# Patient Record
Sex: Female | Born: 1965 | Race: White | Hispanic: No | Marital: Single | Smoking: Current every day smoker
Health system: Southern US, Community
[De-identification: ages and names within clinical notes are randomized; demographics above are authoritative.]

## PROBLEM LIST (undated history)

## (undated) DIAGNOSIS — I10 Essential (primary) hypertension: Secondary | ICD-10-CM

## (undated) DIAGNOSIS — E039 Hypothyroidism, unspecified: Secondary | ICD-10-CM

## (undated) DIAGNOSIS — E782 Mixed hyperlipidemia: Secondary | ICD-10-CM

## (undated) DIAGNOSIS — Z8619 Personal history of other infectious and parasitic diseases: Secondary | ICD-10-CM

## (undated) DIAGNOSIS — I2510 Atherosclerotic heart disease of native coronary artery without angina pectoris: Secondary | ICD-10-CM

## (undated) DIAGNOSIS — Z951 Presence of aortocoronary bypass graft: Secondary | ICD-10-CM

## (undated) DIAGNOSIS — K759 Inflammatory liver disease, unspecified: Secondary | ICD-10-CM

## (undated) DIAGNOSIS — R011 Cardiac murmur, unspecified: Secondary | ICD-10-CM

## (undated) HISTORY — DX: Cardiac murmur, unspecified: R01.1

## (undated) HISTORY — DX: Inflammatory liver disease, unspecified: K75.9

## (undated) HISTORY — PX: CARDIAC SURGERY: SHX584

## (undated) HISTORY — PX: TONSILLECTOMY: SUR1361

---
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
## 2008-09-11 ENCOUNTER — Ambulatory Visit: Payer: Self-pay | Admitting: Internal Medicine

## 2008-10-25 ENCOUNTER — Ambulatory Visit: Payer: Self-pay | Admitting: Internal Medicine

## 2008-10-27 ENCOUNTER — Ambulatory Visit: Payer: Self-pay | Admitting: Internal Medicine

## 2008-11-01 ENCOUNTER — Ambulatory Visit: Payer: Self-pay | Admitting: Family Medicine

## 2009-03-01 ENCOUNTER — Ambulatory Visit: Payer: Self-pay | Admitting: Cardiology

## 2011-08-14 ENCOUNTER — Ambulatory Visit: Payer: Self-pay

## 2012-06-15 ENCOUNTER — Ambulatory Visit: Payer: Self-pay | Admitting: Internal Medicine

## 2012-06-15 LAB — COMPREHENSIVE METABOLIC PANEL
Albumin: 3.5 g/dL (ref 3.4–5.0)
Alkaline Phosphatase: 71 U/L (ref 50–136)
Anion Gap: 9 (ref 7–16)
BUN: 8 mg/dL (ref 7–18)
Bilirubin,Total: 0.5 mg/dL (ref 0.2–1.0)
Creatinine: 0.68 mg/dL (ref 0.60–1.30)
Glucose: 166 mg/dL — ABNORMAL HIGH (ref 65–99)
Osmolality: 268 (ref 275–301)
Potassium: 4.3 mmol/L (ref 3.5–5.1)
SGPT (ALT): 25 U/L (ref 12–78)
Sodium: 133 mmol/L — ABNORMAL LOW (ref 136–145)
Total Protein: 7.1 g/dL (ref 6.4–8.2)

## 2012-06-15 LAB — URINALYSIS, COMPLETE
Bilirubin,UR: NEGATIVE
Blood: NEGATIVE
Glucose,UR: NEGATIVE mg/dL (ref 0–75)
Leukocyte Esterase: NEGATIVE
Nitrite: NEGATIVE
Specific Gravity: 1.01 (ref 1.003–1.030)

## 2012-06-15 LAB — CBC WITH DIFFERENTIAL/PLATELET
Basophil #: 0 10*3/uL (ref 0.0–0.1)
Eosinophil %: 0.5 %
HGB: 16 g/dL (ref 12.0–16.0)
Lymphocyte #: 3.2 10*3/uL (ref 1.0–3.6)
MCH: 30.5 pg (ref 26.0–34.0)
Monocyte %: 4.9 %
Neutrophil %: 68.6 %
Platelet: 295 10*3/uL (ref 150–440)
RBC: 5.25 10*6/uL — ABNORMAL HIGH (ref 3.80–5.20)
RDW: 14.2 % (ref 11.5–14.5)

## 2012-06-17 LAB — URINE CULTURE

## 2016-06-10 ENCOUNTER — Ambulatory Visit
Admission: EM | Admit: 2016-06-10 | Discharge: 2016-06-10 | Disposition: A | Discharge status: Home / Self Care | Payer: BLUE CROSS/BLUE SHIELD | Attending: Family Medicine | Admitting: Family Medicine

## 2016-06-10 ENCOUNTER — Encounter: Payer: Self-pay | Admitting: *Deleted

## 2016-06-10 DIAGNOSIS — J0110 Acute frontal sinusitis, unspecified: Secondary | ICD-10-CM

## 2016-06-10 HISTORY — DX: Atherosclerotic heart disease of native coronary artery without angina pectoris: I25.10

## 2016-06-10 MED ORDER — AMOXICILLIN-POT CLAVULANATE 875-125 MG PO TABS: 1.0000 | ORAL_TABLET | Freq: Two times a day (BID) | ORAL | 0 refills | Status: DC

## 2016-06-10 NOTE — ED Triage Notes (Signed)
Onset of hoarseness 1 month ago and has been persistent since, worse in the morning. A few days ago onset of sore throat, runny nose- yellow, and right ear fullness. Denies fever.

## 2016-06-10 NOTE — Discharge Instructions (Signed)
Take medication as prescribed. Rest. Drink plenty of fluids.  ° °Follow up with your primary care physician this week as needed. Return to Urgent care for new or worsening concerns.  ° °

## 2016-06-10 NOTE — ED Provider Notes (Signed)
[facility] ____________________________________________  Time seen: Approximately 12:50 PM  I have reviewed the triage vital signs and the nursing notes.   HISTORY  Chief Complaint Nasal Congestion; Sore Throat; Headache; Ear Fullness; and Hoarse   HPI Morgan Wright is a 50 y.o. female presenting for the complaints of 3-4 weeks of intermittent hoarse voice and intermittent nasal drainage. Patient reports over the last few days drainage has increased with accompanying sinus pressure, sinus clogged sensation and ears feeling clogged. Patient reports intermittent cough, cough worse at night with post nasal drainage sensation. Patient reports sinuses currently feel clogged with pressure sensation. Reports blowing nose and coughing up thick mucous. Denies fevers. Reports multiple sick contacts around her. Reports unresolved with over-the-counter cough and congestion medications. Denies fevers. States occasional sore throat. Denies history of seasonal allergies.   Reports continues to eat and drink well. Reports has continued to remain active. Denies chest pain, shortness of breath, abdominal pain, dysuria, extremity pain, extremity swelling, dizziness, vision changes or other complaints. Denies recent antibiotic use or recent hospitalization.  No LMP recorded. Patient is postmenopausal.  Past Medical History:  Diagnosis Date  . Coronary artery disease     There are no active problems to display for this patient.   Past Surgical History:  Procedure Laterality Date  . CARDIAC SURGERY    . CESAREAN SECTION        No current facility-administered medications for this encounter.   Current Outpatient Prescriptions:  .  amoxicillin-clavulanate (AUGMENTIN) 875-125 MG tablet, Take 1 tablet by mouth every 12 (twelve) hours., Disp: 20 tablet, Rfl: 0  Allergies Review of patient's allergies indicates no known allergies.  History reviewed. No pertinent family  history.  Social History Social History  Substance Use Topics  . Smoking status: Current Every Day Smoker  . Smokeless tobacco: Never Used  . Alcohol use No    Review of Systems Constitutional: No fever/chills Eyes: No visual changes. ENT:As above Cardiovascular: Denies chest pain. Respiratory: Denies shortness of breath. Gastrointestinal: No abdominal pain.  No nausea, no vomiting.  No diarrhea.  No constipation. Genitourinary: Negative for dysuria. Musculoskeletal: Negative for back pain. Skin: Negative for rash. Neurological: Negative for headaches, focal weakness or numbness.  10-point ROS otherwise negative.  ____________________________________________   PHYSICAL EXAM:  VITAL SIGNS: ED Triage Vitals  Enc Vitals Group     BP 06/10/16 1157 118/79     Pulse Rate 06/10/16 1157 95     Resp 06/10/16 1157 16     Temp 06/10/16 1157 98.7 F (37.1 C)     Temp Source 06/10/16 1157 Oral     SpO2 06/10/16 1157 98 %     Weight 06/10/16 1200 170 lb (77.1 kg)     Height 06/10/16 1200 5\' 1"  (1.549 m)     Head Circumference --      Peak Flow --      Pain Score --      Pain Loc --      Pain Edu? --      Excl. in GC? --     Constitutional: Alert and oriented. Well appearing and in no acute distress. Eyes: Conjunctivae are normal. PERRL. EOMI. Head: Atraumatic.Mild to moderate tenderness to palpation bilateral frontal and mild tenderness to bilateral maxillary sinuses. No swelling. No erythema.   Ears: no erythema,nontender and normal TMs bilaterally.   Nose: nasal congestion with bilateral nasal turbinate erythema and edema.   Mouth/Throat: Mucous membranes are moist.  Oropharynx non-erythematous.No tonsillar swelling  or exudate.  Neck: No stridor.  No cervical spine tenderness to palpation. Hematological/Lymphatic/Immunilogical: No cervical lymphadenopathy. Cardiovascular: Normal rate, regular rhythm. Grossly normal heart sounds.  Good peripheral circulation. Respiratory:  Normal respiratory effort.  No retractions. Lungs CTAB. No wheezes, rales or rhonchi. Good air movement. Occasional dry cough noted in room. Gastrointestinal: Soft and nontender. No distention.  Musculoskeletal: No lower or upper extremity tenderness nor edema.  Bilateral pedal pulses equal and easily palpated. No cervical, thoracic or lumbar tenderness to palpation.  Neurologic:  Normal speech and language. No gross focal neurologic deficits are appreciated. No gait instability. Skin:  Skin is warm, dry and intact. No rash noted. Psychiatric: Mood and affect are normal. Speech and behavior are normal.  ___________________________________________   LABS (all labs ordered are listed, but only abnormal results are displayed)  Labs Reviewed - No data to display ____________________________________________  PROCEDURES Procedures    INITIAL IMPRESSION / ASSESSMENT AND PLAN / ED COURSE  Pertinent labs & imaging results that were available during my care of the patient were reviewed by me and considered in my medical decision making (see chart for details).  Well-appearing patient. No acute distress. Presents for the complaints of cough, congestion, sinus pressure and ear clogged sensation. Suspect frontal sinusitis. Will treat patient with oral Augmentin, and encouraged supportive care. Denies need for prescription cough medications. Encourage rest, fluids, lozenges use. Discussed indication, risks and benefits of medications with patient.  Discussed follow up with Primary care physician this week. Discussed follow up and return parameters including no resolution or any worsening concerns. Patient verbalized understanding and agreed to plan.   ____________________________________________   FINAL CLINICAL IMPRESSION(S) / ED DIAGNOSES  Final diagnoses:  Acute frontal sinusitis, recurrence not specified     Discharge Medication List as of 06/10/2016 12:53 PM    START taking these  medications   Details  amoxicillin-clavulanate (AUGMENTIN) 875-125 MG tablet Take 1 tablet by mouth every 12 (twelve) hours., Starting Tue 06/10/2016, Normal        Note: This dictation was prepared with Dragon dictation along with smaller phrase technology. Any transcriptional errors that result from this process are unintentional.    Clinical Course      Renford DillsLindsey Micholas Drumwright, NP 06/10/16 1754

## 2016-06-13 ENCOUNTER — Telehealth: Payer: Self-pay | Admitting: *Deleted

## 2016-06-13 NOTE — Telephone Encounter (Signed)
Called patient, no answer, left message requesting call back if patient has any questions or concerns.

## 2018-11-12 ENCOUNTER — Ambulatory Visit
Admission: EM | Admit: 2018-11-12 | Discharge: 2018-11-12 | Disposition: A | Discharge status: Home / Self Care | Payer: BLUE CROSS/BLUE SHIELD | Attending: Family Medicine | Admitting: Family Medicine

## 2018-11-12 ENCOUNTER — Other Ambulatory Visit: Payer: Self-pay

## 2018-11-12 ENCOUNTER — Encounter: Payer: Self-pay | Admitting: Emergency Medicine

## 2018-11-12 DIAGNOSIS — J101 Influenza due to other identified influenza virus with other respiratory manifestations: Secondary | ICD-10-CM

## 2018-11-12 DIAGNOSIS — F17200 Nicotine dependence, unspecified, uncomplicated: Secondary | ICD-10-CM | POA: Diagnosis not present

## 2018-11-12 LAB — RAPID INFLUENZA A&B ANTIGENS
Influenza A (ARMC): POSITIVE — AB
Influenza B (ARMC): NEGATIVE

## 2018-11-12 MED ORDER — OSELTAMIVIR PHOSPHATE 75 MG PO CAPS: 75.0000 mg | ORAL_CAPSULE | Freq: Two times a day (BID) | ORAL | 0 refills | Status: DC

## 2018-11-12 NOTE — ED Triage Notes (Signed)
Patient c/o feeling weak, generalized body aches, cough, sinus headache that started 2 days ago.

## 2018-11-13 NOTE — ED Provider Notes (Signed)
[facility]    CSN: [id number] Arrival date & time: 11/12/18  3790     History   Chief Complaint Chief Complaint  Patient presents with  . Cough  . Generalized Body Aches    HPI Morgan Wright is a 53 y.o. female.   The history is provided by the patient.  Cough  Associated symptoms: fever, myalgias and rhinorrhea   Associated symptoms: no wheezing   URI  Presenting symptoms: congestion, cough, fatigue, fever and rhinorrhea   Severity:  Moderate Onset quality:  Sudden Duration:  2 days Timing:  Constant Progression:  Unchanged Chronicity:  New Relieved by:  None tried Ineffective treatments:  None tried Associated symptoms: myalgias   Associated symptoms: no wheezing   Risk factors: sick contacts     Past Medical History:  Diagnosis Date  . Coronary artery disease     There are no active problems to display for this patient.   Past Surgical History:  Procedure Laterality Date  . CARDIAC SURGERY    . CESAREAN SECTION      OB History   No obstetric history on file.      Home Medications    Prior to Admission medications   Medication Sig Start Date End Date Taking? Authorizing Provider  amoxicillin-clavulanate (AUGMENTIN) 875-125 MG tablet Take 1 tablet by mouth every 12 (twelve) hours. 06/10/16   Renford Dills, NP  oseltamivir (TAMIFLU) 75 MG capsule Take 1 capsule (75 mg total) by mouth 2 (two) times daily. 11/12/18   Payton Mccallum, MD    Family History History reviewed. No pertinent family history.  Social History Social History   Tobacco Use  . Smoking status: Current Every Day Smoker  . Smokeless tobacco: Never Used  Substance Use Topics  . Alcohol use: No  . Drug use: Not on file     Allergies   Patient has no known allergies.   Review of Systems Review of Systems  Constitutional: Positive for fatigue and fever.  HENT: Positive for congestion and rhinorrhea.   Respiratory: Positive for cough. Negative for  wheezing.   Musculoskeletal: Positive for myalgias.     Physical Exam Triage Vital Signs ED Triage Vitals  Enc Vitals Group     BP 11/12/18 0828 (!) 158/73     Pulse Rate 11/12/18 0828 91     Resp 11/12/18 0828 18     Temp 11/12/18 0828 98.7 F (37.1 C)     Temp Source 11/12/18 0828 Oral     SpO2 11/12/18 0828 96 %     Weight 11/12/18 0826 180 lb (81.6 kg)     Height 11/12/18 0826 5\' 1"  (1.549 m)     Head Circumference --      Peak Flow --      Pain Score 11/12/18 0826 0     Pain Loc --      Pain Edu? --      Excl. in GC? --    No data found.  Updated Vital Signs BP (!) 158/73 (BP Location: Right Arm)   Pulse 91   Temp 98.7 F (37.1 C) (Oral)   Resp 18   Ht 5\' 1"  (1.549 m)   Wt 81.6 kg   SpO2 96%   BMI 34.01 kg/m   Visual Acuity Right Eye Distance:   Left Eye Distance:   Bilateral Distance:    Right Eye Near:   Left Eye Near:    Bilateral Near:     Physical Exam Vitals  signs and nursing note reviewed.  Constitutional:      General: She is not in acute distress.    Appearance: She is well-developed. She is not toxic-appearing or diaphoretic.  HENT:     Head: Normocephalic and atraumatic.     Mouth/Throat:     Pharynx: Uvula midline. No oropharyngeal exudate.  Neck:     Musculoskeletal: Normal range of motion and neck supple.     Thyroid: No thyromegaly.  Cardiovascular:     Rate and Rhythm: Normal rate and regular rhythm.     Heart sounds: Normal heart sounds.  Pulmonary:     Effort: Pulmonary effort is normal. No respiratory distress.     Breath sounds: Normal breath sounds. No stridor. No wheezing, rhonchi or rales.  Lymphadenopathy:     Cervical: No cervical adenopathy.  Neurological:     Mental Status: She is alert.      UC Treatments / Results  Labs (all labs ordered are listed, but only abnormal results are displayed) Labs Reviewed  RAPID INFLUENZA A&B ANTIGENS ([facility] ONLY) - Abnormal; Notable for the following components:      Result  Value   Influenza A ([facility]) POSITIVE (*)    All other components within normal limits    EKG None  Radiology No results found.  Procedures Procedures (including critical care time)  Medications Ordered in UC Medications - No data to display  Initial Impression / Assessment and Plan / UC Course  I have reviewed the triage vital signs and the nursing notes.  Pertinent labs & imaging results that were available during my care of the patient were reviewed by me and considered in my medical decision making (see chart for details).      Final Clinical Impressions(s) / UC Diagnoses   Final diagnoses:  Influenza A    ED Prescriptions    Medication Sig Dispense Auth. Provider   oseltamivir (TAMIFLU) 75 MG capsule Take 1 capsule (75 mg total) by mouth 2 (two) times daily. 10 capsule Payton Mccallum, MD     1. Lab results and diagnosis reviewed with patient 2. rx as per orders above; reviewed possible side effects, interactions, risks and benefits  3. Recommend supportive treatment with rest, fluids, otc analgesics prn  4. Follow-up prn if symptoms worsen or don't improve   Controlled Substance Prescriptions [place] Controlled Substance Registry consulted? Not Applicable   Payton Mccallum, MD 11/13/18 [phone]

## 2018-11-24 DIAGNOSIS — I10 Essential (primary) hypertension: Secondary | ICD-10-CM | POA: Diagnosis not present

## 2018-11-24 DIAGNOSIS — E782 Mixed hyperlipidemia: Secondary | ICD-10-CM | POA: Insufficient documentation

## 2018-11-24 DIAGNOSIS — I2510 Atherosclerotic heart disease of native coronary artery without angina pectoris: Secondary | ICD-10-CM | POA: Insufficient documentation

## 2018-11-24 DIAGNOSIS — Z720 Tobacco use: Secondary | ICD-10-CM | POA: Insufficient documentation

## 2018-11-26 ENCOUNTER — Other Ambulatory Visit: Payer: Self-pay

## 2018-11-26 ENCOUNTER — Encounter: Payer: Self-pay | Admitting: Family Medicine

## 2018-11-26 ENCOUNTER — Ambulatory Visit (INDEPENDENT_AMBULATORY_CARE_PROVIDER_SITE_OTHER): Payer: Self-pay | Admitting: Family Medicine

## 2018-11-26 VITALS — BP 166/84 | HR 88 | Temp 98.7°F | Resp 16 | Ht 61.0 in | Wt 186.4 lb

## 2018-11-26 DIAGNOSIS — Z8619 Personal history of other infectious and parasitic diseases: Secondary | ICD-10-CM | POA: Insufficient documentation

## 2018-11-26 DIAGNOSIS — I2510 Atherosclerotic heart disease of native coronary artery without angina pectoris: Secondary | ICD-10-CM

## 2018-11-26 DIAGNOSIS — E782 Mixed hyperlipidemia: Secondary | ICD-10-CM

## 2018-11-26 DIAGNOSIS — I10 Essential (primary) hypertension: Secondary | ICD-10-CM

## 2018-11-26 DIAGNOSIS — R490 Dysphonia: Secondary | ICD-10-CM

## 2018-11-26 DIAGNOSIS — Z951 Presence of aortocoronary bypass graft: Secondary | ICD-10-CM

## 2018-11-26 DIAGNOSIS — E039 Hypothyroidism, unspecified: Secondary | ICD-10-CM

## 2018-11-26 DIAGNOSIS — Z7689 Persons encountering health services in other specified circumstances: Secondary | ICD-10-CM

## 2018-11-26 DIAGNOSIS — R7309 Other abnormal glucose: Secondary | ICD-10-CM

## 2018-11-26 DIAGNOSIS — E1169 Type 2 diabetes mellitus with other specified complication: Secondary | ICD-10-CM | POA: Insufficient documentation

## 2018-11-26 DIAGNOSIS — F17210 Nicotine dependence, cigarettes, uncomplicated: Secondary | ICD-10-CM | POA: Insufficient documentation

## 2018-11-26 DIAGNOSIS — Z720 Tobacco use: Secondary | ICD-10-CM

## 2018-11-26 HISTORY — DX: Essential (primary) hypertension: I10

## 2018-11-26 HISTORY — DX: Atherosclerotic heart disease of native coronary artery without angina pectoris: I25.10

## 2018-11-26 HISTORY — DX: Personal history of other infectious and parasitic diseases: Z86.19

## 2018-11-26 HISTORY — DX: Hypothyroidism, unspecified: E03.9

## 2018-11-26 HISTORY — DX: Presence of aortocoronary bypass graft: Z95.1

## 2018-11-26 HISTORY — DX: Mixed hyperlipidemia: E78.2

## 2018-11-26 MED ORDER — METOPROLOL SUCCINATE ER 50 MG PO TB24: 50.0000 mg | ORAL_TABLET | Freq: Every day | ORAL | 1 refills | Status: DC

## 2018-11-26 MED ORDER — LOSARTAN POTASSIUM 50 MG PO TABS: 50.0000 mg | ORAL_TABLET | Freq: Every day | ORAL | 1 refills | Status: DC

## 2018-11-26 NOTE — Assessment & Plan Note (Signed)
Considered Morbid Obesity with BMI >35 - due to co morbid conditions HTN, HLD, CAD, Hypothyroidism Encourage improve lifestyle diet exercise

## 2018-11-26 NOTE — Assessment & Plan Note (Signed)
Chronic problem CAD, s/p CABG Followed by Dr Lady Gary  Starting new med management today, anti HTN with Losartan, BB metoprolol, will add Statin upcoming pending lab results lipid in a week - Should be on Aspirin 81mg  daily as well

## 2018-11-26 NOTE — Patient Instructions (Addendum)
Thank you for coming to the office today.  Referral to [facility] - stay tuned for apt  --------------------------------------------  For your smoking cessation, here is the plan: - TAKE CHANTIX ALWAYS WITH FOOD - to avoid or limit nausea - To quit smoking - wait to start this treatment until you are mentally ready to quit. - Choose a quit date in the future. Start taking the medicine about 1-2 weeks away from your quit date. Reduce the number of cigarettes daily until you eventually QUIT completely on your quit date  Initial rx is good for 1 month - need to talk to [place] to figure out if we can extend for 2 more months of medicine even if quit already smoking  -------  Start BP medications with Losartan and Metoprolol - one a day every day for heart and BP  We will check labs - likely start a Statin cholesterol medicine next week after labs and likely start a Thyroid medicine  Thyroid medicine (Levothyroxine) - should be taken on empty stomach before breakfast  Cholesterol pill in evening.  You are at increased risk of future Cardiovascular complications such as Heart Attack or Stroke from an artery blockage due to abnormal cholesterol and/or risk factors. - As discussed, Statin Cholesterol pills both can both LOWER cholesterol and REDUCE this future risk of heart attack and stroke  If you develop mild aches or pains in muscle or joint that does NOT improve or go away after first 3-4 weeks then this may require [place] to adjust the dose. First I would recommend STOPPING the medication for a few weeks until your ache and pain symptoms completely RESOLVE. Then you can restart at a LOWER DOSE either HALF a pill at bedtime every night or LESS OFTEN such as one pill a week only and then gradually increase to every other day or max dose of 3 times a week  Lastly, sometimes we need to try other versions of this medicine to find one that works for you and does not cause side effects.   DUE for  FASTING BLOOD WORK (no food or drink after midnight before the lab appointment, only water or coffee without cream/sugar on the morning of)  SCHEDULE "Lab Only" visit in the morning at the clinic for lab draw in 1 WEEKS   - Make sure Lab Only appointment is at about 1 week before your next appointment, so that results will be available  For Lab Results, once available within 2-3 days of blood draw, you can can log in to MyChart online to view your results and a brief explanation. Also, we can discuss results at next follow-up visit.   Please schedule a Follow-up Appointment to: Return in about 4 weeks (around 12/24/2018) for 4 week follow-up Smoking, HTN, Thyroid/HLD, ENT update hoarseness.  If you have any other questions or concerns, please feel free to call the office or send a message through MyChart. You may also schedule an earlier appointment if necessary.  Additionally, you may be receiving a survey about your experience at our office within a few days to 1 week by e-mail or mail. We value your feedback.  Saralyn Pilar, DO [facility], [place]

## 2018-11-26 NOTE — Assessment & Plan Note (Signed)
Previous problem based on history No current thyroid lab result  Will order labs TSH Free T4 next week, check result and contact patient if need to add back on levothyroxine Follow-up within 4 weeks

## 2018-11-26 NOTE — Assessment & Plan Note (Signed)
Prior reported positive Hep C in past Untreated, since 1990s  Check lab Hep C ab and reflex, upcoming within a week - if positive confirm still chronic infection will refer to [facility] for Hep C management

## 2018-11-26 NOTE — Assessment & Plan Note (Addendum)
Active smoker, 0.75ppd chronic >40 yr - Has chantix has not started yet - from Cardiology - almost ready to quit  Advised her to start med when ready, try to get her daughter to quit at same time  She should follow-up with [place] within next 3-4 weeks to discuss extension on Chantix if remains smoke free, will pick quit date as well  Discussion today >5 minutes (<10 minutes) specifically on counseling on risks of tobacco use, complications, treatment, smoking cessation.

## 2018-11-26 NOTE — Assessment & Plan Note (Signed)
Followed by Cardiology Dr Waynetta Pean See A&P

## 2018-11-26 NOTE — Progress Notes (Addendum)
Subjective:    Patient ID: Morgan Wright, female    DOB: 30-Dec-1965, 53 y.o.   MRN: [id number]  Morgan Wright is a 53 y.o. female presenting on 11/26/2018 for Establish Care (laryngitis on 03/20 was diagnosed with Flu A); Hypertension; Hyperlipidemia; Hypothyroidism; and Laryngitis  Here to establish care - previously seen at [facility] on 11/12/18 - now asked to get a PCP.  HPI   CHRONIC HTN: Reports history of elevated BP. Not checking BP at home. Acknowledges history of elevated BP in past used to be on medication. Her cardiologist recently advised her to come in to see PCP for BP management Current Meds - None  - cannot recall which pills she had taken in past. Lifestyle: - Trying to improve diet. Not following particular low salt diet Denies CP, dyspnea, HA, edema, dizziness / lightheadedness  Hypothyroidism Prior history of low thyroid based on reported blood work, she used to take medication, does not recall dose. No recent lab result for TSH  HYPERLIPIDEMIA: - Reports concerns history of elevated lipids last checked years ago - used to be on a cholesterol medication statin in past, does not recall which one or dose, interested to restart this Due for labs soon  Tobacco Abuse - Active smoker,  0.75 ppd for >40 years approximately, longest quit duration was 3 weeks only cold [place], problem is her daughter lives at home with her and smokes as well difficult for them both to quit but she will try - She was recently rx Chantix from her Cardiologist Dr Lady Gary few days ago has not started this yet.  Chronic Hoarseness Voice / Laryngitis Reported that about 8 months ago developed a URI that caused laryngitis and she eventually improved and her respiratory symptoms resolved but then she developed persistent laryngitis with hoarse voice - Currently still endorses hoarse voice quality, but she does not have any pain or productive cough or dysphagia difficulty  swallowing or choking  CAD, s/p CABG Recently re-established with [facility] Cardiology Dr Lady Gary, on 11/24/18, prior history of cardiac cath 02/2009 with extensive CAD required CABG x 3 at [facility] Dr Marshell Garfinkel  - Reviewed her recent encounter, she was interested to start medicine and have labs from her PCP now Denies any chest pain or dyspnea  History of Hepatitis C In past reported lab test show positive Hep C, she was never treated, says this was in 1990s. Interested in pursuing this as well with repeat testing and consider refer for treatment.  Recent Influenza - treated and now resolved, doing well afebrile, no cough.  Depression screen PHQ 2/9 11/26/2018  Decreased Interest 0  Down, Depressed, Hopeless 0  PHQ - 2 Score 0    Past Medical History:  Diagnosis Date  . Coronary artery disease   . Heart murmur   . Hepatitis    Past Surgical History:  Procedure Laterality Date  . CARDIAC SURGERY    . CESAREAN SECTION     Social History   Socioeconomic History  . Marital status: Single    Spouse name: Not on file  . Number of children: Not on file  . Years of education: [employer]  . Highest education level: High school graduate  Occupational History  . Occupation: Shipping/Receiving    Comment: [employer]  Social Needs  . Financial resource strain: Not on file  . Food insecurity:    Worry: Not on file    Inability: Not on file  .  Transportation needs:    Medical: Not on file    Non-medical: Not on file  Tobacco Use  . Smoking status: Current Every Day Smoker    Packs/day: 0.75    Years: 40.00    Pack years: 30.00    Types: Cigarettes  . Smokeless tobacco: Never Used  Substance and Sexual Activity  . Alcohol use: No  . Drug use: Never  . Sexual activity: Not on file  Lifestyle  . Physical activity:    Days per week: Not on file    Minutes per session: Not on file  . Stress: Not on file  Relationships  . Social connections:    Talks on phone: Not on file     Gets together: Not on file    Attends religious service: Not on file    Active member of club or organization: Not on file    Attends meetings of clubs or organizations: Not on file    Relationship status: Not on file  . Intimate partner violence:    Fear of current or ex partner: Not on file    Emotionally abused: Not on file    Physically abused: Not on file    Forced sexual activity: Not on file  Other Topics Concern  . Not on file  Social History Narrative  . Not on file   Family History  Problem Relation Age of Onset  . Heart disease Mother   . Anuerysm Father    Current Outpatient Medications on File Prior to Visit  Medication Sig  . CHANTIX STARTING MONTH PAK 0.5 MG X 11 & 1 MG X 42 tablet    No current facility-administered medications on file prior to visit.     Review of Systems Per HPI unless specifically indicated above      Objective:    BP (!) 166/84   Pulse 88   Temp 98.7 F (37.1 C) (Oral)   Resp 16   Ht  (1.549 m)   Wt 186 lb 6.4 oz (84.6 kg)   SpO2 97%   BMI 35.22 kg/m   Wt Readings from Last 3 Encounters:  11/26/18 186 lb 6.4 oz (84.6 kg)  11/12/18 180 lb (81.6 kg)  06/10/16 170 lb (77.1 kg)    Physical Exam Vitals signs and nursing note reviewed.  Constitutional:      General: She is not in acute distress.    Appearance: She is well-developed. She is not diaphoretic.     Comments: Well-appearing, comfortable, cooperative  HENT:     Head: Normocephalic and atraumatic.  Eyes:     General:        Right eye: No discharge.        Left eye: No discharge.     Conjunctiva/sclera: Conjunctivae normal.  Cardiovascular:     Rate and Rhythm: Normal rate and regular rhythm.     Pulses: Normal pulses.     Heart sounds: No murmur.  Pulmonary:     Effort: Pulmonary effort is normal. No respiratory distress.     Breath sounds: Normal breath sounds. No wheezing or rales.  Skin:    General: Skin is warm and dry.     Findings: No erythema or  rash.  Neurological:     Mental Status: She is alert and oriented to person, place, and time.  Psychiatric:        Behavior: Behavior normal.     Comments: Well groomed, good eye contact, normal speech and thoughts  Results for orders placed or performed during the hospital encounter of 11/12/18  Rapid Influenza A&B Antigens ([facility] only)  Result Value Ref Range   Influenza A ([facility]) POSITIVE (A) NEGATIVE   Influenza B ([facility]) NEGATIVE NEGATIVE      Assessment & Plan:   Problem List Items Addressed This Visit    Acquired hypothyroidism    Previous problem based on history No current thyroid lab result  Will order labs TSH Free T4 next week, check result and contact patient if need to add back on levothyroxine Follow-up within 4 weeks      Relevant Medications   metoprolol succinate (TOPROL-XL) 50 MG 24 hr tablet   Other Relevant Orders   TSH   T4, free   Cigarette nicotine dependence without complication    Active smoker, 0.75ppd chronic >40 yr - Has chantix has not started yet - from Cardiology - almost ready to quit  Advised her to start med when ready, try to get her daughter to quit at same time  She should follow-up with [place] within next 3-4 weeks to discuss extension on Chantix if remains smoke free, will pick quit date as well  Discussion today >5 minutes (<10 minutes) specifically on counseling on risks of tobacco use, complications, treatment, smoking cessation.      Relevant Medications   CHANTIX STARTING MONTH PAK 0.5 MG X 11 & 1 MG X 42 tablet   Coronary artery disease involving native coronary artery of native heart without angina pectoris    Chronic problem CAD, s/p CABG Followed by Dr Lady Gary  Starting new med management today, anti HTN with Losartan, BB metoprolol, will add Statin upcoming pending lab results lipid in a week - Should be on Aspirin 81mg  daily as well      Relevant Medications   losartan (COZAAR) 50 MG tablet   metoprolol succinate  (TOPROL-XL) 50 MG 24 hr tablet   Other Relevant Orders   CBC with Differential/Platelet   Essential hypertension - Primary    Elevated BP, uncontrolled - Home BP readings none  Complicated by CAD    Plan:  1. START new anti HTN regimen today - Losartan 50mg  daily, Metoprolol XL 50mg  daily - previously was on BB and other med, now will restart due to her HTN and known CAD 2. Encourage improved lifestyle - low sodium diet, regular exercise 3. Start monitor BP outside office, bring readings to next visit, if persistently >140/90 or new symptoms notify office sooner - her daughter is in training for nurse 4. Follow-up 4 weeks virtual visit  Check labs chemistry within 1-2 weeks on new ARB      Relevant Medications   losartan (COZAAR) 50 MG tablet   metoprolol succinate (TOPROL-XL) 50 MG 24 hr tablet   Other Relevant Orders   CBC with Differential/Platelet   COMPLETE METABOLIC PANEL WITH GFR   History of hepatitis C    Prior reported positive Hep C in past Untreated, since 1990s  Check lab Hep C ab and reflex, upcoming within a week - if positive confirm still chronic infection will refer to [facility] for Hep C management      Relevant Orders   Hepatitis C antibody   Hoarseness of voice    Chronic problem, >8 months now hoarse voice, otherwise asymptomatic Concern with chronic tobacco abuse smoking history Prior onset w/ URI now resolved  May be vocal cord paralysis, polyp, or other  Plan - Discussion on concerns that we cannot rule out possible  malignancy or other more serious etiology, would recommend referral to ENT for consultation and diagnostic eval - may benefit from direct laryngoscopy      Relevant Orders   Ambulatory referral to ENT   Mixed hyperlipidemia    History of uncontrolled cholesterol due for upcoming lipid Known CAD / ASCVD  Plan: 1. Check lipid fasting next week - will review result and consider adding Statin - she agrees to this, discussed today on  recommendations for statin in near future and potential side effects, benefits, she was not aware of side effect on this in the past      Relevant Medications   losartan (COZAAR) 50 MG tablet   metoprolol succinate (TOPROL-XL) 50 MG 24 hr tablet   Other Relevant Orders   Lipid panel   Morbid obesity (HCC)    Considered Morbid Obesity with BMI >35 - due to co morbid conditions HTN, HLD, CAD, Hypothyroidism Encourage improve lifestyle diet exercise      S/P CABG x 3    Followed by Cardiology Dr Waynetta Pean See A&P       Other Visit Diagnoses    Encounter to establish care with new doctor       Abnormal glucose       Relevant Orders   Hemoglobin A1c   Tobacco abuse          S/p Influenza - RESOLVED now s/p tamiflu  Meds ordered this encounter  Medications  . losartan (COZAAR) 50 MG tablet    Sig: Take 1 tablet (50 mg total) by mouth daily.    Dispense:  90 tablet    Refill:  1  . metoprolol succinate (TOPROL-XL) 50 MG 24 hr tablet    Sig: Take 1 tablet (50 mg total) by mouth daily. Take with or immediately following a meal.    Dispense:  90 tablet    Refill:  1    Orders Placed This Encounter  Procedures  . Hemoglobin A1c    Standing Status:   Future    Standing Expiration Date:   02/23/2019  . CBC with Differential/Platelet    Standing Status:   Future    Standing Expiration Date:   02/23/2019  . COMPLETE METABOLIC PANEL WITH GFR    Standing Status:   Future    Standing Expiration Date:   02/23/2019  . Lipid panel    Standing Status:   Future    Standing Expiration Date:   02/23/2019    Order Specific Question:   Has the patient fasted?    Answer:   Yes  . TSH    Standing Status:   Future    Standing Expiration Date:   02/23/2019  . T4, free    Standing Status:   Future    Standing Expiration Date:   02/23/2019  . Hepatitis C antibody    Standing Status:   Future    Standing Expiration Date:   02/23/2019  . Ambulatory referral to ENT    Referral Priority:   Routine     Referral Type:   Consultation    Referral Reason:   Specialty Services Required    Requested Specialty:   Otolaryngology    Number of Visits Requested:   1     Follow up plan: Return in about 4 weeks (around 12/24/2018) for 4 week follow-up Smoking, HTN, Thyroid/HLD, ENT update hoarseness.   Future labs ordered for 12/02/18  Saralyn Pilar, DO [facility] 11/26/2018,  10:06 AM

## 2018-11-26 NOTE — Assessment & Plan Note (Signed)
Chronic problem, >8 months now hoarse voice, otherwise asymptomatic Concern with chronic tobacco abuse smoking history Prior onset w/ URI now resolved  May be vocal cord paralysis, polyp, or other  Plan - Discussion on concerns that we cannot rule out possible malignancy or other more serious etiology, would recommend referral to ENT for consultation and diagnostic eval - may benefit from direct laryngoscopy

## 2018-11-26 NOTE — Assessment & Plan Note (Signed)
History of uncontrolled cholesterol due for upcoming lipid Known CAD / ASCVD  Plan: 1. Check lipid fasting next week - will review result and consider adding Statin - she agrees to this, discussed today on recommendations for statin in near future and potential side effects, benefits, she was not aware of side effect on this in the past

## 2018-11-26 NOTE — Assessment & Plan Note (Addendum)
Elevated BP, uncontrolled - Home BP readings none  Complicated by CAD    Plan:  1. START new anti HTN regimen today - Losartan 50mg  daily, Metoprolol XL 50mg  daily - previously was on BB and other med, now will restart due to her HTN and known CAD 2. Encourage improved lifestyle - low sodium diet, regular exercise 3. Start monitor BP outside office, bring readings to next visit, if persistently >140/90 or new symptoms notify office sooner - her daughter is in training for nurse 4. Follow-up 4 weeks virtual visit  Check labs chemistry within 1-2 weeks on new ARB

## 2018-12-02 ENCOUNTER — Other Ambulatory Visit: Payer: Self-pay

## 2018-12-02 DIAGNOSIS — E782 Mixed hyperlipidemia: Secondary | ICD-10-CM | POA: Diagnosis not present

## 2018-12-02 DIAGNOSIS — I2510 Atherosclerotic heart disease of native coronary artery without angina pectoris: Secondary | ICD-10-CM | POA: Diagnosis not present

## 2018-12-02 DIAGNOSIS — E039 Hypothyroidism, unspecified: Secondary | ICD-10-CM | POA: Diagnosis not present

## 2018-12-02 DIAGNOSIS — R7309 Other abnormal glucose: Secondary | ICD-10-CM | POA: Diagnosis not present

## 2018-12-02 DIAGNOSIS — I10 Essential (primary) hypertension: Secondary | ICD-10-CM | POA: Diagnosis not present

## 2018-12-03 LAB — COMPLETE METABOLIC PANEL WITH GFR
AG Ratio: 1.6 (calc) (ref 1.0–2.5)
ALT: 15 U/L (ref 6–29)
AST: 12 U/L (ref 10–35)
Albumin: 4.1 g/dL (ref 3.6–5.1)
Alkaline phosphatase (APISO): 78 U/L (ref 37–153)
BUN: 9 mg/dL (ref 7–25)
CO2: 27 mmol/L (ref 20–32)
Calcium: 9.9 mg/dL (ref 8.6–10.4)
Chloride: 99 mmol/L (ref 98–110)
Creat: 0.67 mg/dL (ref 0.50–1.05)
GFR, Est African American: 117 mL/min/{1.73_m2} (ref >=60)
GFR, Est Non African American: 101 mL/min/{1.73_m2} (ref >=60)
Globulin: 2.5 g/dL (calc) (ref 1.9–3.7)
Glucose, Bld: 196 mg/dL — ABNORMAL HIGH (ref 65–99)
Potassium: 4.9 mmol/L (ref 3.5–5.3)
Sodium: 137 mmol/L (ref 135–146)
Total Bilirubin: 0.5 mg/dL (ref 0.2–1.2)
Total Protein: 6.6 g/dL (ref 6.1–8.1)

## 2018-12-03 LAB — HEPATITIS C ANTIBODY
Hepatitis C Ab: NONREACTIVE
SIGNAL TO CUT-OFF: 0.07 (ref <1.00)

## 2018-12-03 LAB — LIPID PANEL
Cholesterol: 256 mg/dL — ABNORMAL HIGH (ref <200)
HDL: 31 mg/dL — ABNORMAL LOW (ref >=50)
LDL Cholesterol (Calc): 187 mg/dL (calc) — ABNORMAL HIGH
Non-HDL Cholesterol (Calc): 225 mg/dL (calc) — ABNORMAL HIGH (ref <130)
Total CHOL/HDL Ratio: 8.3 (calc) — ABNORMAL HIGH (ref <5.0)
Triglycerides: 197 mg/dL — ABNORMAL HIGH (ref <150)

## 2018-12-03 LAB — CBC WITH DIFFERENTIAL/PLATELET
Absolute Monocytes: 589 cells/uL (ref 200–950)
Basophils Absolute: 75 cells/uL (ref 0–200)
Basophils Relative: 0.7 %
Eosinophils Absolute: 75 cells/uL (ref 15–500)
Eosinophils Relative: 0.7 %
HCT: 45.8 % — ABNORMAL HIGH (ref 35.0–45.0)
Hemoglobin: 15.9 g/dL — ABNORMAL HIGH (ref 11.7–15.5)
Lymphs Abs: 3167 cells/uL (ref 850–3900)
MCH: 29.7 pg (ref 27.0–33.0)
MCHC: 34.7 g/dL (ref 32.0–36.0)
MCV: 85.4 fL (ref 80.0–100.0)
MPV: 10.3 fL (ref 7.5–12.5)
Monocytes Relative: 5.5 %
Neutro Abs: 6795 cells/uL (ref 1500–7800)
Neutrophils Relative %: 63.5 %
Platelets: 326 10*3/uL (ref 140–400)
RBC: 5.36 10*6/uL — ABNORMAL HIGH (ref 3.80–5.10)
RDW: 12.8 % (ref 11.0–15.0)
Total Lymphocyte: 29.6 %
WBC: 10.7 10*3/uL (ref 3.8–10.8)

## 2018-12-03 LAB — T4, FREE: Free T4: 1.1 ng/dL (ref 0.8–1.8)

## 2018-12-03 LAB — HEMOGLOBIN A1C
Hgb A1c MFr Bld: 9.2 % of total Hgb — ABNORMAL HIGH (ref <5.7)
Mean Plasma Glucose: 217 (calc)
eAG (mmol/L): 12 (calc)

## 2018-12-03 LAB — TSH: TSH: 8.28 mIU/L — ABNORMAL HIGH

## 2018-12-06 ENCOUNTER — Encounter: Payer: Self-pay | Admitting: Family Medicine

## 2018-12-06 DIAGNOSIS — R7309 Other abnormal glucose: Secondary | ICD-10-CM

## 2018-12-06 DIAGNOSIS — E1169 Type 2 diabetes mellitus with other specified complication: Secondary | ICD-10-CM | POA: Insufficient documentation

## 2018-12-09 ENCOUNTER — Other Ambulatory Visit: Payer: Self-pay | Admitting: Family Medicine

## 2018-12-09 DIAGNOSIS — E782 Mixed hyperlipidemia: Secondary | ICD-10-CM

## 2018-12-09 DIAGNOSIS — E039 Hypothyroidism, unspecified: Secondary | ICD-10-CM

## 2018-12-09 MED ORDER — ROSUVASTATIN CALCIUM 40 MG PO TABS: 40.0000 mg | ORAL_TABLET | Freq: Every day | ORAL | 1 refills | Status: DC

## 2018-12-09 MED ORDER — LEVOTHYROXINE SODIUM 25 MCG PO TABS: 25.0000 ug | ORAL_TABLET | Freq: Every day | ORAL | 1 refills | Status: DC

## 2018-12-24 ENCOUNTER — Ambulatory Visit (INDEPENDENT_AMBULATORY_CARE_PROVIDER_SITE_OTHER): Payer: BLUE CROSS/BLUE SHIELD | Admitting: Family Medicine

## 2018-12-24 ENCOUNTER — Encounter: Payer: Self-pay | Admitting: Family Medicine

## 2018-12-24 ENCOUNTER — Other Ambulatory Visit: Payer: Self-pay | Admitting: Family Medicine

## 2018-12-24 ENCOUNTER — Other Ambulatory Visit: Payer: Self-pay

## 2018-12-24 DIAGNOSIS — E782 Mixed hyperlipidemia: Secondary | ICD-10-CM | POA: Diagnosis not present

## 2018-12-24 DIAGNOSIS — E039 Hypothyroidism, unspecified: Secondary | ICD-10-CM | POA: Diagnosis not present

## 2018-12-24 DIAGNOSIS — R7309 Other abnormal glucose: Secondary | ICD-10-CM

## 2018-12-24 DIAGNOSIS — I10 Essential (primary) hypertension: Secondary | ICD-10-CM | POA: Diagnosis not present

## 2018-12-24 MED ORDER — METFORMIN HCL 500 MG PO TABS: 500.0000 mg | ORAL_TABLET | Freq: Two times a day (BID) | ORAL | 1 refills | Status: DC

## 2018-12-24 NOTE — Patient Instructions (Addendum)
AVS given by phone, see note. No MyChart access

## 2018-12-24 NOTE — Assessment & Plan Note (Signed)
Improved HTN control Complicated by CAD    Plan:  1. Continue current regimen - Losartan 50mg  daily, Metoprolol XL 50mg  daily 2. Encourage improved lifestyle - low sodium diet, regular exercise 3. Keep monitor BP outside office, bring readings to next visit, if persistently >140/90 or new symptoms notify office sooner - her daughter is in training for nurse 4. Follow-up 2.5 month

## 2018-12-24 NOTE — Assessment & Plan Note (Signed)
Uncontrolled cholesterol off statin Last lipid panel 11/2018 Known CAD  Plan: 1. Continue current meds rx since last visit - Rosuvastatin 40mg  daily 2. Encourage improved lifestyle - low carb/cholesterol, reduce portion size, continue improving regular exercise  Check lipid again for statin progress and LFT in 2.5 months

## 2018-12-24 NOTE — Progress Notes (Signed)
Virtual Visit via Telephone The purpose of this virtual visit is to provide medical care while limiting exposure to the novel coronavirus (COVID19) for both patient and office staff.  Consent was obtained for phone visit:  Yes.   Answered questions that patient had about telehealth interaction:  Yes.   I discussed the limitations, risks, security and privacy concerns of performing an evaluation and management service by telephone. I also discussed with the patient that there may be a patient responsible charge related to this service. The patient expressed understanding and agreed to proceed.  Patient Location: Home Provider Location: [facility])  ---------------------------------------------------------------------- Chief Complaint  Patient presents with  . Hypertension  . Diabetes  . Hyperlipidemia  . Hypothyroidism    S: Reviewed CMA documentation. I have called patient and gathered additional HPI as follows:  Elevated A1c vs Likely New Type 2 Diabetes diagnosis Interval update last lab showed A1c 9.2, previously no readings, not aware of hyperglycemia. CBGs: Not checking Meds: None Currently on ARB Lifestyle: - Diet (not following DM diet)  - Exercise (limited currently) Denies hypoglycemia, polyuria, visual changes, numbness or tingling.  CHRONIC HTN: Reports since last visit improved on current meds, started last visit Current Meds - Losartan  daily, Metoprolol XL succinate  daily Denies CP, dyspnea, HA, edema, dizziness / lightheadedness  Hypothyroidism Prior history of low thyroid Last lab showed elevated TSH 8.2. not currently on thyroid med Interval she was prescribed Levothyroxine daily, sent already she has started this, doing well today She is not aware of any symptoms of low thyroid. She could not tell in past  HYPERLIPIDEMIA: Known CAD and history of HLD. No longer on statin medication, but did well on it  before Interval lab result 11/2018 show lipid uncontrolled LDL 187, TG 197, Total Chol 256, already sent rx statin, rosuvastatin  daily - Today reports taking this med, and tolerating it well without myalgia  Follow-up Chronic Hoarseness Voice / Laryngitis Last visit referred to ENT - now her apt is scheduled for 01/12/19 - will follow-up at that time   Denies any high risk travel to areas of current concern for COVID19. Denies any known or suspected exposure to person with or possibly with COVID19.  Denies any fevers, chills, sweats, body ache, cough, shortness of breath, sinus pain or pressure, headache, abdominal pain, diarrhea  Past Medical History:  Diagnosis Date  . Coronary artery disease   . Heart murmur   . Hepatitis    Social History   Tobacco Use  . Smoking status: Current Every Day Smoker    Packs/day: 0.75    Years: 40.00    Pack years: 30.00    Types: Cigarettes  . Smokeless tobacco: Never Used  Substance Use Topics  . Alcohol use: No  . Drug use: Never    Current Outpatient Medications:  .  CHANTIX STARTING MONTH PAK 0.5 MG X 11 & 1 MG X 42 tablet, , Disp: , Rfl:  .  levothyroxine (SYNTHROID, LEVOTHROID) 25 MCG tablet, Take 1 tablet (25 mcg total) by mouth daily before breakfast., Disp: 90 tablet, Rfl: 1 .  losartan (COZAAR) 50 MG tablet, Take 1 tablet (50 mg total) by mouth daily., Disp: 90 tablet, Rfl: 1 .  metoprolol succinate (TOPROL-XL) 50 MG 24 hr tablet, Take 1 tablet (50 mg total) by mouth daily. Take with or immediately following a meal., Disp: 90 tablet, Rfl: 1 .  rosuvastatin (CRESTOR) 40 MG tablet, Take 1 tablet (40 mg  total) by mouth at bedtime., Disp: 90 tablet, Rfl: 1 .  metFORMIN (GLUCOPHAGE) 500 MG tablet, Take 1 tablet (500 mg total) by mouth 2 (two) times daily with a meal. Start with 1 tablet in PM with meal, then increase as advise., Disp: 180 tablet, Rfl: 1  Depression screen [facility] 2/9 12/24/2018 11/26/2018  Decreased Interest 0 0  Down,  Depressed, Hopeless 0 0  PHQ - 2 Score 0 0    No flowsheet data found.  -------------------------------------------------------------------------- O: No physical exam performed due to remote telephone encounter.  Lab results reviewed.  Recent Results (from the past 2160 hour(s))  Rapid Influenza A&B Antigens ([facility] only)     Status: Abnormal   Collection Time: 11/12/18  8:30 AM  Result Value Ref Range   Influenza A ([facility]) POSITIVE (A) NEGATIVE   Influenza B ([facility]) NEGATIVE NEGATIVE    Comment: Performed at [facility], [address], [place], [place] [address]  Hepatitis C antibody     Status: None   Collection Time: 12/02/18  8:33 AM  Result Value Ref Range   Hepatitis C Ab NON-REACTIVE NON-REACTI   SIGNAL TO CUT-OFF 0.07 <1.00    Comment: . HCV antibody was non-reactive. There is no laboratory  evidence of HCV infection. . In most cases, no further action is required. However, if recent HCV exposure is suspected, a test for HCV RNA (test code 60454) is suggested. . For additional information please refer to http://education.[facility].com/faq/FAQ22v1 (This link is being provided for informational/ educational purposes only.) .   T4, free     Status: None   Collection Time: 12/02/18  8:33 AM  Result Value Ref Range   Free T4 1.1 0.8 - 1.8 ng/dL  TSH     Status: Abnormal   Collection Time: 12/02/18  8:33 AM  Result Value Ref Range   TSH 8.28 (H) mIU/L    Comment:           Reference Range .           > or = 20 Years  0.40-4.50 .                Pregnancy Ranges           First trimester    0.26-2.66           Second trimester   0.55-2.73           Third trimester    0.43-2.91   Lipid panel     Status: Abnormal   Collection Time: 12/02/18  8:33 AM  Result Value Ref Range   Cholesterol 256 (H) <200 mg/dL   HDL 31 (L) > OR = 50 mg/dL   Triglycerides 098 (H) <150 mg/dL   LDL Cholesterol (Calc) 187 (H) mg/dL (calc)    Comment: Reference  range: <100 . Desirable range <100 mg/dL for primary prevention;   <70 mg/dL for patients with CHD or diabetic patients  with > or = 2 CHD risk factors. Marland Kitchen LDL-C is now calculated using the Martin-Hopkins  calculation, which is a validated novel method providing  better accuracy than the Friedewald equation in the  estimation of LDL-C.  Horald Pollen et al. Lenox Ahr. 1191;478(29): 2061-2068  (http://education.[facility].com/faq/FAQ164)    Total CHOL/HDL Ratio 8.3 (H) <5.0 (calc)   Non-HDL Cholesterol (Calc) 225 (H) <130 mg/dL (calc)    Comment: Non-HDL level > or = 220 is very high and may indicate  genetic familial hypercholesterolemia (FH). Clinical  assessment and measurement  of blood lipid levels  should be considered for all first-degree relatives  of patients with an FH diagnosis. . For patients with diabetes plus 1 major ASCVD risk  factor, treating to a non-HDL-C goal of <100 mg/dL  (LDL-C of <96<70 mg/dL) is considered a therapeutic  option.   COMPLETE METABOLIC PANEL WITH GFR     Status: Abnormal   Collection Time: 12/02/18  8:33 AM  Result Value Ref Range   Glucose, Bld 196 (H) 65 - 99 mg/dL    Comment: .            Fasting reference interval . For someone without known diabetes, a glucose value >125 mg/dL indicates that they may have diabetes and this should be confirmed with a follow-up test. .    BUN 9 7 - 25 mg/dL   Creat 2.950.67 2.840.50 - 1.321.05 mg/dL    Comment: For patients >53 years of age, the reference limit for Creatinine is approximately 13% higher for people identified as African-American. .    GFR, Est Non African American 101 > OR = 60 mL/min/1.1273m2   GFR, Est African American 117 > OR = 60 mL/min/1.8473m2   BUN/Creatinine Ratio NOT APPLICABLE 6 - 22 (calc)   Sodium 137 135 - 146 mmol/L   Potassium 4.9 3.5 - 5.3 mmol/L   Chloride 99 98 - 110 mmol/L   CO2 27 20 - 32 mmol/L   Calcium 9.9 8.6 - 10.4 mg/dL   Total Protein 6.6 6.1 - 8.1 g/dL   Albumin 4.1  3.6 - 5.1 g/dL   Globulin 2.5 1.9 - 3.7 g/dL (calc)   AG Ratio 1.6 1.0 - 2.5 (calc)   Total Bilirubin 0.5 0.2 - 1.2 mg/dL   Alkaline phosphatase (APISO) 78 37 - 153 U/L   AST 12 10 - 35 U/L   ALT 15 6 - 29 U/L  CBC with Differential/Platelet     Status: Abnormal   Collection Time: 12/02/18  8:33 AM  Result Value Ref Range   WBC 10.7 3.8 - 10.8 Thousand/uL   RBC 5.36 (H) 3.80 - 5.10 Million/uL   Hemoglobin 15.9 (H) 11.7 - 15.5 g/dL   HCT 44.045.8 (H) 10.235.0 - 72.545.0 %   MCV 85.4 80.0 - 100.0 fL   MCH 29.7 27.0 - 33.0 pg   MCHC 34.7 32.0 - 36.0 g/dL   RDW 36.612.8 44.011.0 - 34.715.0 %   Platelets 326 140 - 400 Thousand/uL   MPV 10.3 7.5 - 12.5 fL   Neutro Abs 6,795 1,500 - 7,800 cells/uL   Lymphs Abs 3,167 850 - 3,900 cells/uL   Absolute Monocytes 589 200 - 950 cells/uL   Eosinophils Absolute 75 15 - 500 cells/uL   Basophils Absolute 75 0 - 200 cells/uL   Neutrophils Relative % 63.5 %   Total Lymphocyte 29.6 %   Monocytes Relative 5.5 %   Eosinophils Relative 0.7 %   Basophils Relative 0.7 %  Hemoglobin A1c     Status: Abnormal   Collection Time: 12/02/18  8:33 AM  Result Value Ref Range   Hgb A1c MFr Bld 9.2 (H) <5.7 % of total Hgb    Comment: For someone without known diabetes, a hemoglobin A1c value of 6.5% or greater indicates that they may have  diabetes and this should be confirmed with a follow-up  test. . For someone with known diabetes, a value <7% indicates  that their diabetes is well controlled and a value  greater than or equal to 7% indicates  suboptimal  control. A1c targets should be individualized based on  duration of diabetes, age, comorbid conditions, and  other considerations. . Currently, no consensus exists regarding use of hemoglobin A1c for diagnosis of diabetes for children. .    Mean Plasma Glucose 217 (calc)   eAG (mmol/L) 12.0 (calc)    -------------------------------------------------------------------------- A&P:  Problem List Items Addressed This  Visit    Acquired hypothyroidism    Chronic problem, now recent lab show mild-moderate elevated TSH  Plan Continue now on Levothyroxine daily Re-check in 2.5 months      Elevated hemoglobin A1c    Suspected new dx T2DM with A1c >9 and fasting glucose >190 Await 2nd A1c result before confirming new dx -With  Hyperglycemia Complications - hyperlipidemia obesity, hypothyroidism, CAD - increases risk of future cardiovascular complications   Plan:  1. START Metformin 500mg  BID, titrate up, likely higher up to 1000mg  at next visit - Briefly mentioned recommend GLP1 agent in future for ASCVD risk reduction among A1c benefit and wt loss 2. Encourage improved lifestyle - low carb, low sugar diet, reduce portion size, continue improving regular exercise 3. Check CBG , bring log to next visit for review 4. Continue ARB, Statin 5. Follow-up 2.5 months       Relevant Medications   metFORMIN (GLUCOPHAGE) 500 MG tablet   Other Relevant Orders   Ambulatory referral to Chronic Care Management Services   Essential hypertension - Primary    Improved HTN control Complicated by CAD    Plan:  1. Continue current regimen - Losartan 50mg  daily, Metoprolol XL 50mg  daily 2. Encourage improved lifestyle - low sodium diet, regular exercise 3. Keep monitor BP outside office, bring readings to next visit, if persistently >140/90 or new symptoms notify office sooner - her daughter is in training for nurse 4. Follow-up 2.5 month      Mixed hyperlipidemia    Uncontrolled cholesterol off statin Last lipid panel 11/2018 Known CAD  Plan: 1. Continue current meds rx since last visit - Rosuvastatin 40mg  daily 2. Encourage improved lifestyle - low carb/cholesterol, reduce portion size, continue improving regular exercise  Check lipid again for statin progress and LFT in 2.5 months         Meds ordered this encounter  Medications  . metFORMIN (GLUCOPHAGE) 500 MG tablet    Sig: Take 1 tablet  (500 mg total) by mouth 2 (two) times daily with a meal. Start with 1 tablet in PM with meal, then increase as advise.    Dispense:  180 tablet    Refill:  1    Follow-up: - Return in 2.5 months for lab results - thyroid, sugar, HLD, f/u ENT - Future labs ordered for 02/28/19 - A1c, CMET, Lipid, TSH, Free T4  Patient verbalizes understanding with the above medical recommendations including the limitation of remote medical advice.  Specific follow-up and call-back criteria were given for patient to follow-up or seek medical care more urgently if needed.  - Time spent in direct consultation with patient on phone: 17 minutes  Saralyn Pilar, DO [facility] 12/24/2018, 9:57 AM

## 2018-12-24 NOTE — Assessment & Plan Note (Signed)
Chronic problem, now recent lab show mild-moderate elevated TSH  Plan Continue now on Levothyroxine daily Re-check in 2.5 months

## 2018-12-24 NOTE — Assessment & Plan Note (Signed)
Suspected new dx T2DM with A1c >9 and fasting glucose >190 Await 2nd A1c result before confirming new dx -With  Hyperglycemia Complications - hyperlipidemia obesity, hypothyroidism, CAD - increases risk of future cardiovascular complications   Plan:  1. START Metformin 500mg  BID, titrate up, likely higher up to 1000mg  at next visit - Briefly mentioned recommend GLP1 agent in future for ASCVD risk reduction among A1c benefit and wt loss 2. Encourage improved lifestyle - low carb, low sugar diet, reduce portion size, continue improving regular exercise 3. Check CBG , bring log to next visit for review 4. Continue ARB, Statin 5. Follow-up 2.5 months

## 2019-01-10 ENCOUNTER — Ambulatory Visit: Payer: Self-pay | Admitting: *Deleted

## 2019-01-10 ENCOUNTER — Telehealth: Payer: Self-pay

## 2019-01-10 DIAGNOSIS — R7309 Other abnormal glucose: Secondary | ICD-10-CM

## 2019-01-10 NOTE — Chronic Care Management (AMB) (Signed)
  Chronic Care Management   Outreach Note  01/10/2019 Name: Morgan Wright MRN: [id number] DOB: August 31, 1965  Referred by: Smitty Cords, DO Reason for referral : No chief complaint on file.   An unsuccessful telephone outreach was attempted today. The patient was referred to the case management team by for assistance with chronic care management and care coordination.   Follow Up Plan: The CM team will reach out to the patient again over the next 14 days.   Unable to leave a message at either number.   Ma Rings Abad Manard RN, BSN Nurse Case Health and safety inspector [facility]  [phone]) Business Mobile

## 2019-01-12 DIAGNOSIS — R490 Dysphonia: Secondary | ICD-10-CM | POA: Diagnosis not present

## 2019-01-12 DIAGNOSIS — J381 Polyp of vocal cord and larynx: Secondary | ICD-10-CM | POA: Diagnosis not present

## 2019-01-12 DIAGNOSIS — F17200 Nicotine dependence, unspecified, uncomplicated: Secondary | ICD-10-CM | POA: Diagnosis not present

## 2019-01-24 ENCOUNTER — Telehealth: Payer: Self-pay

## 2019-01-24 ENCOUNTER — Other Ambulatory Visit: Payer: BLUE CROSS/BLUE SHIELD

## 2019-01-25 ENCOUNTER — Other Ambulatory Visit
Admission: RE | Admit: 2019-01-25 | Discharge: 2019-01-25 | Disposition: A | Discharge status: Home / Self Care | Payer: BC Managed Care – PPO | Source: Ambulatory Visit | Attending: Unknown Physician Specialty | Admitting: Unknown Physician Specialty

## 2019-01-25 ENCOUNTER — Other Ambulatory Visit: Payer: Self-pay

## 2019-01-25 DIAGNOSIS — Z1159 Encounter for screening for other viral diseases: Secondary | ICD-10-CM | POA: Insufficient documentation

## 2019-01-26 LAB — NOVEL CORONAVIRUS, NAA (HOSP ORDER, SEND-OUT TO REF LAB; TAT 18-24 HRS): SARS-CoV-2, NAA: NOT DETECTED

## 2019-01-27 ENCOUNTER — Ambulatory Visit: Payer: Self-pay | Admitting: *Deleted

## 2019-01-27 DIAGNOSIS — I10 Essential (primary) hypertension: Secondary | ICD-10-CM

## 2019-01-27 DIAGNOSIS — E782 Mixed hyperlipidemia: Secondary | ICD-10-CM

## 2019-01-28 ENCOUNTER — Ambulatory Visit: Payer: BC Managed Care – PPO | Admitting: Anesthesiology

## 2019-01-28 ENCOUNTER — Ambulatory Visit
Admission: RE | Admit: 2019-01-28 | Discharge: 2019-01-28 | Disposition: A | Discharge status: Home / Self Care | Payer: BC Managed Care – PPO | Attending: Unknown Physician Specialty | Admitting: Unknown Physician Specialty

## 2019-01-28 ENCOUNTER — Encounter
Admission: RE | Disposition: A | Discharge status: Home / Self Care | Payer: Self-pay | Source: Home / Self Care | Attending: Unknown Physician Specialty

## 2019-01-28 DIAGNOSIS — Z7984 Long term (current) use of oral hypoglycemic drugs: Secondary | ICD-10-CM | POA: Diagnosis not present

## 2019-01-28 DIAGNOSIS — J381 Polyp of vocal cord and larynx: Secondary | ICD-10-CM | POA: Diagnosis not present

## 2019-01-28 DIAGNOSIS — E0590 Thyrotoxicosis, unspecified without thyrotoxic crisis or storm: Secondary | ICD-10-CM | POA: Insufficient documentation

## 2019-01-28 DIAGNOSIS — I10 Essential (primary) hypertension: Secondary | ICD-10-CM | POA: Insufficient documentation

## 2019-01-28 DIAGNOSIS — Z951 Presence of aortocoronary bypass graft: Secondary | ICD-10-CM | POA: Diagnosis not present

## 2019-01-28 DIAGNOSIS — E119 Type 2 diabetes mellitus without complications: Secondary | ICD-10-CM | POA: Diagnosis not present

## 2019-01-28 DIAGNOSIS — F17210 Nicotine dependence, cigarettes, uncomplicated: Secondary | ICD-10-CM | POA: Diagnosis not present

## 2019-01-28 DIAGNOSIS — E782 Mixed hyperlipidemia: Secondary | ICD-10-CM | POA: Diagnosis not present

## 2019-01-28 DIAGNOSIS — Z79899 Other long term (current) drug therapy: Secondary | ICD-10-CM | POA: Diagnosis not present

## 2019-01-28 DIAGNOSIS — Z8619 Personal history of other infectious and parasitic diseases: Secondary | ICD-10-CM | POA: Diagnosis not present

## 2019-01-28 DIAGNOSIS — I2510 Atherosclerotic heart disease of native coronary artery without angina pectoris: Secondary | ICD-10-CM | POA: Diagnosis not present

## 2019-01-28 DIAGNOSIS — R490 Dysphonia: Secondary | ICD-10-CM | POA: Diagnosis not present

## 2019-01-28 HISTORY — PX: MICROLARYNGOSCOPY W/VOCAL CORD INJECTION: SHX2665

## 2019-01-28 LAB — GLUCOSE, CAPILLARY: Glucose-Capillary: 167 mg/dL — ABNORMAL HIGH (ref 70–99)

## 2019-01-28 SURGERY — MICROLARYNGOSCOPY, WITH VOCAL CORD INJECTION
Anesthesia: General | Site: Throat | Laterality: Bilateral

## 2019-01-28 MED ORDER — LIDOCAINE HCL (CARDIAC) PF 100 MG/5ML IV SOSY
PREFILLED_SYRINGE | INTRAVENOUS | Status: DC | PRN
  Administered 2019-01-28: 40 mg via INTRAVENOUS

## 2019-01-28 MED ORDER — OXYCODONE HCL 5 MG PO TABS: 5.0000 mg | ORAL_TABLET | Freq: Once | ORAL | Status: AC | PRN

## 2019-01-28 MED ORDER — ACETAMINOPHEN 10 MG/ML IV SOLN
1000.0000 mg | Freq: Once | INTRAVENOUS | Status: AC
  Administered 2019-01-28: 1000 mg via INTRAVENOUS

## 2019-01-28 MED ORDER — FENTANYL CITRATE (PF) 100 MCG/2ML IJ SOLN: 25.0000 ug | INTRAMUSCULAR | Status: DC | PRN

## 2019-01-28 MED ORDER — SCOPOLAMINE 1 MG/3DAYS TD PT72
1.0000 | MEDICATED_PATCH | Freq: Once | TRANSDERMAL | Status: DC
  Administered 2019-01-28: 1.5 mg via TRANSDERMAL

## 2019-01-28 MED ORDER — GLYCOPYRROLATE 0.2 MG/ML IJ SOLN
INTRAMUSCULAR | Status: DC | PRN
  Administered 2019-01-28: .1 mg via INTRAVENOUS

## 2019-01-28 MED ORDER — ACETAMINOPHEN 325 MG PO TABS: 325.0000 mg | ORAL_TABLET | ORAL | Status: DC | PRN

## 2019-01-28 MED ORDER — DEXAMETHASONE SODIUM PHOSPHATE 4 MG/ML IJ SOLN
INTRAMUSCULAR | Status: DC | PRN
  Administered 2019-01-28: 8 mg via INTRAVENOUS

## 2019-01-28 MED ORDER — LACTATED RINGERS IV SOLN
10.0000 mL/h | INTRAVENOUS | Status: DC
  Administered 2019-01-28: 08:00:00 10 mL/h via INTRAVENOUS

## 2019-01-28 MED ORDER — FENTANYL CITRATE (PF) 100 MCG/2ML IJ SOLN
INTRAMUSCULAR | Status: DC | PRN
  Administered 2019-01-28: 25 ug via INTRAVENOUS
  Administered 2019-01-28: 100 ug via INTRAVENOUS

## 2019-01-28 MED ORDER — PROMETHAZINE HCL 25 MG/ML IJ SOLN: 6.2500 mg | INTRAMUSCULAR | Status: DC | PRN

## 2019-01-28 MED ORDER — ONDANSETRON HCL 4 MG/2ML IJ SOLN
INTRAMUSCULAR | Status: DC | PRN
  Administered 2019-01-28: 4 mg via INTRAVENOUS

## 2019-01-28 MED ORDER — PHENYLEPHRINE HCL 0.5 % NA SOLN
NASAL | Status: DC | PRN
  Administered 2019-01-28: 10 mL via TOPICAL

## 2019-01-28 MED ORDER — MIDAZOLAM HCL 5 MG/5ML IJ SOLN
INTRAMUSCULAR | Status: DC | PRN
  Administered 2019-01-28: 2 mg via INTRAVENOUS

## 2019-01-28 MED ORDER — OXYCODONE HCL 5 MG/5ML PO SOLN
5.0000 mg | Freq: Once | ORAL | Status: AC | PRN
  Administered 2019-01-28: 5 mg via ORAL

## 2019-01-28 MED ORDER — SUCCINYLCHOLINE CHLORIDE 20 MG/ML IJ SOLN
INTRAMUSCULAR | Status: DC | PRN
  Administered 2019-01-28: 100 mg via INTRAVENOUS

## 2019-01-28 MED ORDER — PROPOFOL 10 MG/ML IV BOLUS
INTRAVENOUS | Status: DC | PRN
  Administered 2019-01-28: 200 mg via INTRAVENOUS
  Administered 2019-01-28: 50 mg via INTRAVENOUS

## 2019-01-28 MED ORDER — ACETAMINOPHEN 160 MG/5ML PO SOLN: 325.0000 mg | ORAL | Status: DC | PRN

## 2019-01-28 SURGICAL SUPPLY — 13 items
"PENCIL ELECTRO HAND CTR " (MISCELLANEOUS) ×1 IMPLANT
CANISTER SUCT 1200ML W/VALVE (MISCELLANEOUS) ×2 IMPLANT
ELECT REM PT RETURN 9FT ADLT (ELECTROSURGICAL) ×3
ELECTRODE REM PT RTRN 9FT ADLT (ELECTROSURGICAL) ×1 IMPLANT
GLOVE BIO SURGEON STRL SZ7.5 (GLOVE) ×6 IMPLANT
KIT TURNOVER KIT A (KITS) ×3 IMPLANT
NDL HYPO 25GX1X1/2 BEV (NEEDLE) ×1 IMPLANT
NEEDLE HYPO 25GX1X1/2 BEV (NEEDLE) ×3 IMPLANT
NS IRRIG 500ML POUR BTL (IV SOLUTION) ×3 IMPLANT
PACK ENT CUSTOM (PACKS) ×3 IMPLANT
PENCIL ELECTRO HAND CTR (MISCELLANEOUS) ×3 IMPLANT
STRAP BODY AND KNEE 60X3 (MISCELLANEOUS) ×3 IMPLANT
TOWEL OR 17X26 4PK STRL BLUE (TOWEL DISPOSABLE) ×3 IMPLANT

## 2019-01-28 NOTE — Anesthesia Procedure Notes (Signed)
Procedure Name: Intubation Date/Time: 01/28/2019 8:29 AM Performed by: Jimmy Picket, CRNA Pre-anesthesia Checklist: Patient identified, Emergency Drugs available, Suction available, Patient being monitored and Timeout performed Patient Re-evaluated:Patient Re-evaluated prior to induction Oxygen Delivery Method: Circle system utilized Preoxygenation: Pre-oxygenation with 100% oxygen Induction Type: IV induction Ventilation: Mask ventilation without difficulty Laryngoscope Size: Miller and 2 Grade View: Grade I Tube type: MLT Tube size: 6.0 mm Number of attempts: 1 Placement Confirmation: ETT inserted through vocal cords under direct vision,  positive ETCO2 and breath sounds checked- equal and bilateral Tube secured with: Tape Dental Injury: Teeth and Oropharynx as per pre-operative assessment

## 2019-01-28 NOTE — Chronic Care Management (AMB) (Signed)
  Chronic Care Management   Initial Visit Note  01/28/2019 Name: Morgan Wright MRN: [id number] DOB: 1966/06/30  Referred by: Olin Hauser, DO Reason for referral : Chronic Care Management (DM education)   Morgan Wright is a 53 y.o. year old female who is a primary care patient of Olin Hauser, DO. The CCM team was consulted for assistance with chronic disease management and care coordination needs.   Review of patient status, including review of consultants reports, relevant laboratory and other test results, and collaboration with appropriate care team members and the patient's provider was performed as part of comprehensive patient evaluation and provision of chronic care management services.    SDOH (Social Determinants of Health) screening performed today. See Care Plan Entry related to challenges with: Tobacco Use  Objective:   Goals Addressed            This Visit's Progress   . I want to work on getting my sugars down  (pt-stated)       Current Barriers:  Marland Kitchen Knowledge Deficits related to basic Diabetes pathophysiology and self care/management . Does not have glucometer to monitor blood sugar  Case Manager Clinical Goal(s):  Over the next 90 days, patient will demonstrate improved adherence to prescribed treatment plan for diabetes self care/management as evidenced by:  . adherence to ADA/ carb modified diet  Interventions:  . Provided education to patient about basic DM disease process . Discussed plans with patient for ongoing care management follow up and provided patient with direct contact information for care management team  . Verbal education related to ADA/Carb Modified diet . Plan to mail out written education related to ADA/Diabetic diet  Patient Self Care Activities:  . Adheres to prescribed ADA/carb modified  Initial goal documentation         [name] was given information about Chronic Care Management services today  including:  1. CCM service includes personalized support from designated clinical staff supervised by her physician, including individualized plan of care and coordination with other care providers 2. 24/7 contact phone numbers for assistance for urgent and routine care needs. 3. Service will only be billed when office clinical staff spend 20 minutes or more in a month to coordinate care. 4. Only one practitioner may furnish and bill the service in a calendar month. 5. The patient may stop CCM services at any time (effective at the end of the month) by phone call to the office staff. 6. The patient will be responsible for cost sharing (co-pay) of up to 20% of the service fee (after annual deductible is met).  Patient agreed to services and verbal consent obtained.   Plan:   The care management team will reach out to the patient again over the next 14 days.  The patient has been provided with contact information for the care management team and has been advised to call with any health related questions or concerns.   Merlene Morse Renn Stille RN, BSN Nurse Case Pharmacist, [facility]  [phone]) Business Mobile

## 2019-01-28 NOTE — Discharge Instructions (Signed)
Scopolamine skin patches °REMOVE PATCH IN 72 HOURS AND WASH HANDS IMMEDIATELY °What is this medicine? °SCOPOLAMINE (skoe POL a meen) is used to prevent nausea and vomiting caused by motion sickness, anesthesia and surgery. °This medicine may be used for other purposes; ask your health care provider or pharmacist if you have questions. °COMMON BRAND NAME(S): Transderm Scop °What should I tell my health care provider before I take this medicine? °They need to know if you have any of these conditions: °-are scheduled to have a gastric secretion test °-glaucoma °-heart disease °-kidney disease °-liver disease °-lung or breathing disease, like asthma °-mental illness °-prostate disease °-seizures °-stomach or intestine problems °-trouble passing urine °-an unusual or allergic reaction to scopolamine, atropine, other medicines, foods, dyes, or preservatives °-pregnant or trying to get pregnant °-breast-feeding °How should I use this medicine? °This medicine is for external use only. Follow the directions on the prescription label. Wear only 1 patch at a time. Choose an area behind the ear, that is clean, dry, hairless and free from any cuts or irritation. Wipe the area with a clean dry tissue. Peel off the plastic backing of the skin patch, trying not to touch the adhesive side with your hands. Do not cut the patches. Firmly apply to the area you have chosen, with the metallic side of the patch to the skin and the tan-colored side showing. Once firmly in place, wash your hands well with soap and water. Do not get this medicine into your eyes. °After removing the patch, wash your hands and the area behind your ear thoroughly with soap and water. The patch will still contain some medicine after use. To avoid accidental contact or ingestion by children or pets, fold the used patch in half with the sticky side together and throw away in the trash out of the reach of children and pets. If you need to use a second patch after  you remove the first, place it behind the other ear. °A special MedGuide will be given to you by the pharmacist with each prescription and refill. Be sure to read this information carefully each time. °Talk to your pediatrician regarding the use of this medicine in children. Special care may be needed. °Overdosage: If you think you have taken too much of this medicine contact a poison control center or emergency room at once. °NOTE: This medicine is only for you. Do not share this medicine with others. °What if I miss a dose? °This does not apply. This medicine is not for regular use. °What may interact with this medicine? °-alcohol °-antihistamines for allergy cough and cold °-atropine °-certain medicines for anxiety or sleep °-certain medicines for bladder problems like oxybutynin, tolterodine °-certain medicines for depression like amitriptyline, fluoxetine, sertraline °-certain medicines for stomach problems like dicyclomine, hyoscyamine °-certain medicines for Parkinson's disease like benztropine, trihexyphenidyl °-certain medicines for seizures like phenobarbital, primidone °-general anesthetics like halothane, isoflurane, methoxyflurane, propofol °-ipratropium °-local anesthetics like lidocaine, pramoxine, tetracaine °-medicines that relax muscles for surgery °-phenothiazines like chlorpromazine, mesoridazine, prochlorperazine, thioridazine °-narcotic medicines for pain °-other belladonna alkaloids °This list may not describe all possible interactions. Give your health care provider a list of all the medicines, herbs, non-prescription drugs, or dietary supplements you use. Also tell them if you smoke, drink alcohol, or use illegal drugs. Some items may interact with your medicine. °What should I watch for while using this medicine? °Limit contact with water while swimming and bathing because the patch may fall off. If the patch falls off,   throw it away and put a new one behind the other ear. °You may get  drowsy or dizzy. Do not drive, use machinery, or do anything that needs mental alertness until you know how this medicine affects you. Do not stand or sit up quickly, especially if you are an older patient. This reduces the risk of dizzy or fainting spells. Alcohol may interfere with the effect of this medicine. Avoid alcoholic drinks. °Your mouth may get dry. Chewing sugarless gum or sucking hard candy, and drinking plenty of water may help. Contact your healthcare professional if the problem does not go away or is severe. °This medicine may cause dry eyes and blurred vision. If you wear contact lenses, you may feel some discomfort. Lubricating drops may help. See your healthcare professional if the problem does not go away or is severe. °If you are going to need surgery, an MRI, CT scan, or other procedure, tell your healthcare professional that you are using this medicine. You may need to remove the patch before the procedure. °What side effects may I notice from receiving this medicine? °Side effects that you should report to your doctor or health care professional as soon as possible: °-allergic reactions like skin rash, itching or hives; swelling of the face, lips, or tongue °-blurred vision °-changes in vision °-confusion °-dizziness °-eye pain °-fast, irregular heartbeat °-hallucinations, loss of contact with reality °-nausea, vomiting °-pain or trouble passing urine °-restlessness °-seizures °-skin irritation °-stomach pain °Side effects that usually do not require medical attention (report to your doctor or health care professional if they continue or are bothersome): °-drowsiness °-dry mouth °-headache °-sore throat °This list may not describe all possible side effects. Call your doctor for medical advice about side effects. You may report side effects to FDA at 1-800-FDA-1088. °Where should I keep my medicine? °Keep out of the reach of children. °Store at room temperature between 20 and 25 degrees C (68  and 77 degrees F). Keep this medicine in the foil package until ready to use. Throw away any unused medicine after the expiration date. °NOTE: This sheet is a summary. It may not cover all possible information. If you have questions about this medicine, talk to your doctor, pharmacist, or health care provider. °© 2019 Elsevier/Gold Standard (2017-10-30 16:14:46) ° ° °General Anesthesia, Adult, Care After °This sheet gives you information about how to care for yourself after your procedure. Your health care provider may also give you more specific instructions. If you have problems or questions, contact your health care provider. °What can I expect after the procedure? °After the procedure, the following side effects are common: °· Pain or discomfort at the IV site. °· Nausea. °· Vomiting. °· Sore throat. °· Trouble concentrating. °· Feeling cold or chills. °· Weak or tired. °· Sleepiness and fatigue. °· Soreness and body aches. These side effects can affect parts of the body that were not involved in surgery. °Follow these instructions at home: ° °For at least 24 hours after the procedure: °· Have a responsible adult stay with you. It is important to have someone help care for you until you are awake and alert. °· Rest as needed. °· Do not: °? Participate in activities in which you could fall or become injured. °? Drive. °? Use heavy machinery. °? Drink alcohol. °? Take sleeping pills or medicines that cause drowsiness. °? Make important decisions or sign legal documents. °? Take care of children on your own. °Eating and drinking °· Follow any instructions from   your health care provider about eating or drinking restrictions. °· When you feel hungry, start by eating small amounts of foods that are soft and easy to digest (bland), such as toast. Gradually return to your regular diet. °· Drink enough fluid to keep your urine pale yellow. °· If you vomit, rehydrate by drinking water, juice, or clear broth. °General  instructions °· If you have sleep apnea, surgery and certain medicines can increase your risk for breathing problems. Follow instructions from your health care provider about wearing your sleep device: °? Anytime you are sleeping, including during daytime naps. °? While taking prescription pain medicines, sleeping medicines, or medicines that make you drowsy. °· Return to your normal activities as told by your health care provider. Ask your health care provider what activities are safe for you. °· Take over-the-counter and prescription medicines only as told by your health care provider. °· If you smoke, do not smoke without supervision. °· Keep all follow-up visits as told by your health care provider. This is important. °Contact a health care provider if: °· You have nausea or vomiting that does not get better with medicine. °· You cannot eat or drink without vomiting. °· You have pain that does not get better with medicine. °· You are unable to pass urine. °· You develop a skin rash. °· You have a fever. °· You have redness around your IV site that gets worse. °Get help right away if: °· You have difficulty breathing. °· You have chest pain. °· You have blood in your urine or stool, or you vomit blood. °Summary °· After the procedure, it is common to have a sore throat or nausea. It is also common to feel tired. °· Have a responsible adult stay with you for the first 24 hours after general anesthesia. It is important to have someone help care for you until you are awake and alert. °· When you feel hungry, start by eating small amounts of foods that are soft and easy to digest (bland), such as toast. Gradually return to your regular diet. °· Drink enough fluid to keep your urine pale yellow. °· Return to your normal activities as told by your health care provider. Ask your health care provider what activities are safe for you. °This information is not intended to replace advice given to you by your health care  provider. Make sure you discuss any questions you have with your health care provider. °Document Released: 11/17/2000 Document Revised: 03/27/2017 Document Reviewed: 03/27/2017 °Elsevier Interactive Patient Education © 2019 Elsevier Inc. ° °

## 2019-01-28 NOTE — Anesthesia Postprocedure Evaluation (Signed)
Anesthesia Post Note  Patient: Morgan Wright  Procedure(s) Performed: MICROLARYNGOSCOPY WITH VOCAL CORD EXCISION (Bilateral Throat)  Patient location during evaluation: PACU Anesthesia Type: Wright Level of consciousness: awake Pain management: pain level controlled Vital Signs Assessment: post-procedure vital signs reviewed and stable Respiratory status: respiratory function stable Cardiovascular status: stable Postop Assessment: no signs of nausea or vomiting Anesthetic complications: no    Jola Babinski

## 2019-01-28 NOTE — Op Note (Signed)
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  After general endotracheal anesthesia the table was turned 90 degrees.  Patient was draped in usual fashion for general surgery.  The upper teeth were edentulous therefore a 4 x 4 sponge was placed across the upper gums.  A Dedo laryngoscope was introduced into the airway and suspended.  Examination larynx showed a large left anterior vocal cord polyp and a small right vocal cord polyp this was documented using the 0 degree Hopkins rod.  With the photodocumentation completed the operating microscope was brought in the field.  There was an obvious large left anterior vocal cord polyp the Shapshay microlaryngeal instruments were then brought into the field.  The larynx was then bathed with a phenylephrine lidocaine solution.  Using the microlaryngeal instruments the cup forceps were used to grasp the large polyp on the left and the 45 degrees scissors were then used to excise it completely care was taken not to violate the vocal ligament.  The mucosal edges were then laid back in anatomic position.  In similar fashion the polyp was removed from the right.  Both polyps removed the larynx again was bathed with a phenylephrine lidocaine solution.  There was no active bleeding.  Photodocumentation was performed after removal of polyps.  The patient was then returned to anesthesia where she was awakened in the operating room taken recovery room in stable condition.   Specimens: Right and left vocal cord polyps Dispo:   Good  Plan: Discharged home follow-up 2 weeks  continue cessation of smoking  Davina Poke  01/28/2019 8:53 AM

## 2019-01-28 NOTE — Anesthesia Preprocedure Evaluation (Addendum)
Anesthesia Evaluation  Patient identified by MRN, date of birth, ID band Patient awake    Reviewed: Allergy & Precautions, NPO status , Patient's Chart, lab work & pertinent test results  Airway Mallampati: II  TM Distance: >3 FB     Dental  (+) Missing, Poor Dentition   Pulmonary Current Smoker,    breath sounds clear to auscultation       Cardiovascular hypertension, + CAD and + CABG (2010)   Rhythm:Regular Rate:Normal  Established cardiology care at [facility] 11/24/18 - stable from cardiac standpoint after CABG 10 years ago   Neuro/Psych    GI/Hepatic (+) Hepatitis -, C  Endo/Other  diabetes, Type 2Hypothyroidism   Renal/GU      Musculoskeletal   Abdominal   Peds  Hematology   Anesthesia Other Findings Hoarseness  Reproductive/Obstetrics                            Anesthesia Physical Anesthesia Plan  ASA: III  Anesthesia Plan: General   Post-op Pain Management:    Induction: Intravenous  PONV Risk Score and Plan: 2 and Ondansetron and Dexamethasone  Airway Management Planned: Oral ETT  Additional Equipment:   Intra-op Plan:   Post-operative Plan:   Informed Consent: I have reviewed the patients History and Physical, chart, labs and discussed the procedure including the risks, benefits and alternatives for the proposed anesthesia with the patient or authorized representative who has indicated his/her understanding and acceptance.     Dental advisory given  Plan Discussed with: CRNA  Anesthesia Plan Comments:         Anesthesia Quick Evaluation

## 2019-01-28 NOTE — Transfer of Care (Signed)
Immediate Anesthesia Transfer of Care Note  Patient: Morgan Wright  Procedure(s) Performed: MICROLARYNGOSCOPY WITH VOCAL CORD EXCISION (Bilateral Throat)  Patient Location: PACU  Anesthesia Type: General  Level of Consciousness: awake, alert  and patient cooperative  Airway and Oxygen Therapy: Patient Spontanous Breathing and Patient connected to supplemental oxygen  Post-op Assessment: Post-op Vital signs reviewed, Patient's Cardiovascular Status Stable, Respiratory Function Stable, Patent Airway and No signs of Nausea or vomiting  Post-op Vital Signs: Reviewed and stable  Complications: No apparent anesthesia complications

## 2019-01-28 NOTE — H&P (Signed)
The patient's history has been reviewed, patient examined, no change in status, stable for surgery.  Questions were answered to the patients satisfaction.  

## 2019-01-28 NOTE — Patient Instructions (Signed)
Thank you allowing the Chronic Care Management Team to be a part of your care! It was a pleasure speaking with you today!  CCM (Chronic Care Management) Team   Recie Cirrincione RN, BSN Nurse Care Coordinator  [phone]  Duanne Moron PharmD  Clinical Pharmacist  [phone]  Dickie La LCSW Clinical Social Worker [phone]  Goals Addressed            This Visit's Progress   . I want to work on getting my sugars down  (pt-stated)       Current Barriers:  Marland Kitchen Knowledge Deficits related to basic Diabetes pathophysiology and self care/management . Does not have glucometer to monitor blood sugar  Case Manager Clinical Goal(s):  Over the next 90 days, patient will demonstrate improved adherence to prescribed treatment plan for diabetes self care/management as evidenced by:  . adherence to ADA/ carb modified diet  Interventions:  . Provided education to patient about basic DM disease process . Discussed plans with patient for ongoing care management follow up and provided patient with direct contact information for care management team  . Verbal education related to ADA/Carb Modified diet . Plan to mail out written education related to ADA/Diabetic diet  Patient Self Care Activities:  . Adheres to prescribed ADA/carb modified  Initial goal documentation        The patient verbalized understanding of instructions provided today and declined a print copy of patient instruction materials.   The care management team will reach out to the patient again over the next 14 days.  The patient has been provided with contact information for the care management team and has been advised to call with any health related questions or concerns.

## 2019-01-31 ENCOUNTER — Encounter: Payer: Self-pay | Admitting: Unknown Physician Specialty

## 2019-02-01 LAB — SURGICAL PATHOLOGY

## 2019-02-08 ENCOUNTER — Ambulatory Visit: Payer: Self-pay | Admitting: Pharmacist

## 2019-02-08 DIAGNOSIS — R7309 Other abnormal glucose: Secondary | ICD-10-CM

## 2019-02-08 NOTE — Chronic Care Management (AMB) (Signed)
  Care Management   Follow Up Note   02/08/2019 Name: Morgan Wright MRN: [id number] DOB: 02/13/1966  Referred by: Olin Hauser, DO Reason for referral : Chronic Care Management (Initial Patient Outreach Call)   [name] is a 53 y.o. year old female who is a primary care patient of Olin Hauser, DO. The care management team was consulted for assistance with care management and care coordination needs.    Receive a referral from Care Management Nurse Case Manager to reach out to patient to address medication questions and to assist patient with obtaining glucometer.  Was unable to reach patient via telephone today and have left HIPAA compliant message with [name] who answers the phone. Provide my phone number and request a call back.    The care management team will reach out to the patient again over the next 7 days.   Harlow Asa, PharmD, [place] [employer] [phone]

## 2019-02-14 ENCOUNTER — Telehealth: Payer: Self-pay

## 2019-02-14 DIAGNOSIS — F17200 Nicotine dependence, unspecified, uncomplicated: Secondary | ICD-10-CM | POA: Diagnosis not present

## 2019-02-14 DIAGNOSIS — R490 Dysphonia: Secondary | ICD-10-CM | POA: Diagnosis not present

## 2019-02-15 ENCOUNTER — Telehealth: Payer: Self-pay

## 2019-02-15 ENCOUNTER — Ambulatory Visit: Payer: Self-pay | Admitting: Pharmacist

## 2019-02-15 DIAGNOSIS — R7309 Other abnormal glucose: Secondary | ICD-10-CM

## 2019-02-15 NOTE — Chronic Care Management (AMB) (Signed)
  Care Management   Follow Up Note   02/15/2019 Name: Morgan Wright MRN: [id number] DOB: May 28, 1966  Referred by: Olin Hauser, DO Reason for referral : Chronic Care Management (Initial Patient Outreach Call)   Morgan Wright is a 53 y.o. year old female who is a primary care patient of Olin Hauser, DO. The care management team was consulted for assistance with care management and care coordination needs.    Receive a referral from Care Management Nurse Case Manager to reach out to patient to address medication questions and to assist patient with obtaining glucometer.  Was unable to reach patient via telephone today and have left a message, including my office hours, with Morgan Wright who answers the phone. Outreach attempt #2.  The care management team will reach out to the patient again over the next 14 days.   Harlow Asa, PharmD, [place] [employer] [phone]

## 2019-02-17 ENCOUNTER — Telehealth: Payer: Self-pay

## 2019-02-21 ENCOUNTER — Telehealth: Payer: Self-pay

## 2019-02-21 NOTE — Telephone Encounter (Signed)
Open in error

## 2019-02-24 ENCOUNTER — Telehealth: Payer: Self-pay

## 2019-02-28 ENCOUNTER — Telehealth: Payer: Self-pay

## 2019-02-28 ENCOUNTER — Other Ambulatory Visit: Payer: BLUE CROSS/BLUE SHIELD

## 2019-02-28 ENCOUNTER — Ambulatory Visit: Payer: Self-pay | Admitting: Pharmacist

## 2019-02-28 NOTE — Chronic Care Management (AMB) (Signed)
  Care Management   Follow Up Note   02/28/2019 Name: Zhavia LYANA ASBILL MRN: [id number] DOB: July 06, 1966  Referred by: Olin Hauser, DO Reason for referral : Chronic Care Management (Initial Outreach Attempt #3)   Signora A Exley is a 53 y.o. year old female who is a primary care patient of Olin Hauser, DO. The care management team was consulted for assistance with care management and care coordination needs.    Receive a referral from Care Management Nurse Case Manager to reach out to patient to address medication questions and to assist patient with obtaining glucometer.  Was unable to reach patient via telephone again today. Unable to leave message for patient as mobile phone voicemail is full and no voicemail picks up on home line. Outreach attempt #3.  Have previously left messages with [name], listed on patient's designated party release in chart.  Place a coordination of care call to CM Nurse Case Manager Janci Minor to let her know that I was unable to reach patient. Note CM RNCM has appointment scheduled to outreach to patient again later in the week.  The care management team is available to follow up with the patient after provider or CM Nurse Case Manager conversation with the patient regarding recommendation for CM Pharmacy engagement and subsequent re-referral.  Harlow Asa, PharmD, [place] [phone]

## 2019-03-03 ENCOUNTER — Ambulatory Visit: Payer: Self-pay | Admitting: *Deleted

## 2019-03-03 ENCOUNTER — Telehealth: Payer: Self-pay

## 2019-03-03 DIAGNOSIS — R7309 Other abnormal glucose: Secondary | ICD-10-CM

## 2019-03-03 NOTE — Chronic Care Management (AMB) (Signed)
  Chronic Care Management   Outreach Note  03/03/2019 Name: Morgan Wright MRN: [id number] DOB: 1966/07/03  Referred by: Olin Hauser, DO Reason for referral : Chronic Care Management (Unsuccessful Outreach to discuss DM education)   A second unsuccessful telephone outreach was attempted today. The patient was referred to the case management team for assistance with chronic care management and care coordination. I was able to speak to patient's spouse who stated he would give patient the message to call me.   Follow Up Plan: A HIPPA compliant phone message was left for the patient providing contact information and requesting a return call.    Merlene Morse Xochilt Conant RN, BSN Nurse Case Pharmacist, [facility] Care Management  [phone]) Business Mobile

## 2019-03-03 NOTE — Patient Instructions (Signed)
Thank you allowing the Chronic Care Management Team to be a part of your care! It was a pleasure speaking with you today!   CCM (Chronic Care Management) Team   Yorel Redder RN, BSN Nurse Care Coordinator  [phone]  Harlow Asa PharmD  Clinical Pharmacist  [phone]  Eula Fried LCSW Clinical Social Worker [phone]    Print copy of patient instructions provided.   The patient has been provided with contact information for the care management team and has been advised to call with any health related questions or concerns.  Prediabetes Eating Plan Prediabetes is a condition that causes blood sugar (glucose) levels to be higher than normal. This increases the risk for developing diabetes. In order to prevent diabetes from developing, your health care provider may recommend a diet and other lifestyle changes to help you:  Control your blood glucose levels.  Improve your cholesterol levels.  Manage your blood pressure. Your health care provider may recommend working with a diet and nutrition specialist (dietitian) to make a meal plan that is best for you. What are tips for following this plan? Lifestyle  Set weight loss goals with the help of your health care team. It is recommended that most people with prediabetes lose 7% of their current body weight.  Exercise for at least 30 minutes at least 5 days a week.  Attend a support group or seek ongoing support from a mental health counselor.  Take over-the-counter and prescription medicines only as told by your health care provider. Reading food labels  Read food labels to check the amount of fat, salt (sodium), and sugar in prepackaged foods. Avoid foods that have: ? Saturated fats. ? Trans fats. ? Added sugars.  Avoid foods that have more than 300 milligrams (mg) of sodium per serving. Limit your daily sodium intake to less than 2,300 mg each day. Shopping  Avoid buying pre-made and processed foods.  Cooking  Cook with olive oil. Do not use butter, lard, or ghee.  Bake, broil, grill, or boil foods. Avoid frying. Meal planning   Work with your dietitian to develop an eating plan that is right for you. This may include: ? Tracking how many calories you take in. Use a food diary, notebook, or mobile application to track what you eat at each meal. ? Using the glycemic index (GI) to plan your meals. The index tells you how quickly a food will raise your blood glucose. Choose low-GI foods. These foods take a longer time to raise blood glucose.  Consider following a Mediterranean diet. This diet includes: ? Several servings each day of fresh fruits and vegetables. ? Eating fish at least twice a week. ? Several servings each day of whole grains, beans, nuts, and seeds. ? Using olive oil instead of other fats. ? Moderate alcohol consumption. ? Eating small amounts of red meat and whole-fat dairy.  If you have high blood pressure, you may need to limit your sodium intake or follow a diet such as the DASH eating plan. DASH is an eating plan that aims to lower high blood pressure. What foods are recommended? The items listed below may not be a complete list. Talk with your dietitian about what dietary choices are best for you. Grains Whole grains, such as whole-wheat or whole-grain breads, crackers, cereals, and pasta. Unsweetened oatmeal. Bulgur. Barley. Quinoa. Brown rice. Corn or whole-wheat flour tortillas or taco shells. Vegetables Lettuce. Spinach. Peas. Beets. Cauliflower. Cabbage. Broccoli. Carrots. Tomatoes. Squash. Eggplant. Herbs. Peppers. Onions.  Cucumbers. Brussels sprouts. Fruits Berries. Bananas. Apples. Oranges. Grapes. Papaya. Mango. Pomegranate. Kiwi. Grapefruit. Cherries. Meats and other protein foods Seafood. Poultry without skin. Lean cuts of pork and beef. Tofu. Eggs. Nuts. Beans. Dairy Low-fat or fat-free dairy products, such as yogurt, cottage cheese, and cheese.  Beverages Water. Tea. Coffee. Sugar-free or diet soda. Seltzer water. Lowfat or no-fat milk. Milk alternatives, such as soy or almond milk. Fats and oils Olive oil. Canola oil. Sunflower oil. Grapeseed oil. Avocado. Walnuts. Sweets and desserts Sugar-free or low-fat pudding. Sugar-free or low-fat ice cream and other frozen treats. Seasoning and other foods Herbs. Sodium-free spices. Mustard. Relish. Low-fat, low-sugar ketchup. Low-fat, low-sugar barbecue sauce. Low-fat or fat-free mayonnaise. What foods are not recommended? The items listed below may not be a complete list. Talk with your dietitian about what dietary choices are best for you. Grains Refined white flour and flour products, such as bread, pasta, snack foods, and cereals. Vegetables Canned vegetables. Frozen vegetables with butter or cream sauce. Fruits Fruits canned with syrup. Meats and other protein foods Fatty cuts of meat. Poultry with skin. Breaded or fried meat. Processed meats. Dairy Full-fat yogurt, cheese, or milk. Beverages Sweetened drinks, such as sweet iced tea and soda. Fats and oils Butter. Lard. Ghee. Sweets and desserts Baked goods, such as cake, cupcakes, pastries, cookies, and cheesecake. Seasoning and other foods Spice mixes with added salt. Ketchup. Barbecue sauce. Mayonnaise. Summary  To prevent diabetes from developing, you may need to make diet and other lifestyle changes to help control blood sugar, improve cholesterol levels, and manage your blood pressure.  Set weight loss goals with the help of your health care team. It is recommended that most people with prediabetes lose 7 percent of their current body weight.  Consider following a Mediterranean diet that includes plenty of fresh fruits and vegetables, whole grains, beans, nuts, seeds, fish, lean meat, low-fat dairy, and healthy oils. This information is not intended to replace advice given to you by your health care provider. Make sure  you discuss any questions you have with your health care provider. Document Released: 12/26/2014 Document Revised: 12/03/2018 Document Reviewed: 10/15/2016 Elsevier Patient Education  2020 [employer]

## 2019-03-07 ENCOUNTER — Other Ambulatory Visit: Payer: Self-pay

## 2019-03-07 ENCOUNTER — Ambulatory Visit: Payer: BLUE CROSS/BLUE SHIELD | Admitting: Family Medicine

## 2019-03-07 DIAGNOSIS — E039 Hypothyroidism, unspecified: Secondary | ICD-10-CM | POA: Diagnosis not present

## 2019-03-07 DIAGNOSIS — E782 Mixed hyperlipidemia: Secondary | ICD-10-CM | POA: Diagnosis not present

## 2019-03-07 DIAGNOSIS — R7309 Other abnormal glucose: Secondary | ICD-10-CM | POA: Diagnosis not present

## 2019-03-07 DIAGNOSIS — I10 Essential (primary) hypertension: Secondary | ICD-10-CM | POA: Diagnosis not present

## 2019-03-08 LAB — COMPLETE METABOLIC PANEL WITH GFR
AG Ratio: 1.6 (calc) (ref 1.0–2.5)
ALT: 14 U/L (ref 6–29)
AST: 11 U/L (ref 10–35)
Albumin: 4 g/dL (ref 3.6–5.1)
Alkaline phosphatase (APISO): 72 U/L (ref 37–153)
BUN: 10 mg/dL (ref 7–25)
CO2: 28 mmol/L (ref 20–32)
Calcium: 9.7 mg/dL (ref 8.6–10.4)
Chloride: 101 mmol/L (ref 98–110)
Creat: 0.69 mg/dL (ref 0.50–1.05)
GFR, Est African American: 116 mL/min/{1.73_m2} (ref >=60)
GFR, Est Non African American: 100 mL/min/{1.73_m2} (ref >=60)
Globulin: 2.5 g/dL (calc) (ref 1.9–3.7)
Glucose, Bld: 173 mg/dL — ABNORMAL HIGH (ref 65–99)
Potassium: 4.3 mmol/L (ref 3.5–5.3)
Sodium: 139 mmol/L (ref 135–146)
Total Bilirubin: 0.4 mg/dL (ref 0.2–1.2)
Total Protein: 6.5 g/dL (ref 6.1–8.1)

## 2019-03-08 LAB — TSH: TSH: 7.85 mIU/L — ABNORMAL HIGH

## 2019-03-08 LAB — LIPID PANEL
Cholesterol: 145 mg/dL (ref <200)
HDL: 32 mg/dL — ABNORMAL LOW (ref >=50)
LDL Cholesterol (Calc): 86 mg/dL (calc)
Non-HDL Cholesterol (Calc): 113 mg/dL (calc) (ref <130)
Total CHOL/HDL Ratio: 4.5 (calc) (ref <5.0)
Triglycerides: 175 mg/dL — ABNORMAL HIGH (ref <150)

## 2019-03-08 LAB — HEMOGLOBIN A1C
Hgb A1c MFr Bld: 8.3 % of total Hgb — ABNORMAL HIGH (ref <5.7)
Mean Plasma Glucose: 192 (calc)
eAG (mmol/L): 10.6 (calc)

## 2019-03-08 LAB — T4, FREE: Free T4: 0.9 ng/dL (ref 0.8–1.8)

## 2019-03-13 ENCOUNTER — Encounter: Payer: Self-pay | Admitting: Family Medicine

## 2019-03-14 ENCOUNTER — Other Ambulatory Visit: Payer: Self-pay | Admitting: Family Medicine

## 2019-03-14 ENCOUNTER — Ambulatory Visit (INDEPENDENT_AMBULATORY_CARE_PROVIDER_SITE_OTHER): Payer: BC Managed Care – PPO | Admitting: Family Medicine

## 2019-03-14 ENCOUNTER — Other Ambulatory Visit: Payer: Self-pay

## 2019-03-14 ENCOUNTER — Encounter: Payer: Self-pay | Admitting: Family Medicine

## 2019-03-14 VITALS — BP 132/74 | HR 75 | Temp 99.0°F | Resp 16 | Ht 61.0 in | Wt 182.0 lb

## 2019-03-14 DIAGNOSIS — I10 Essential (primary) hypertension: Secondary | ICD-10-CM

## 2019-03-14 DIAGNOSIS — E1169 Type 2 diabetes mellitus with other specified complication: Secondary | ICD-10-CM

## 2019-03-14 DIAGNOSIS — E039 Hypothyroidism, unspecified: Secondary | ICD-10-CM

## 2019-03-14 DIAGNOSIS — Z23 Encounter for immunization: Secondary | ICD-10-CM | POA: Diagnosis not present

## 2019-03-14 DIAGNOSIS — Z1239 Encounter for other screening for malignant neoplasm of breast: Secondary | ICD-10-CM

## 2019-03-14 DIAGNOSIS — J381 Polyp of vocal cord and larynx: Secondary | ICD-10-CM

## 2019-03-14 DIAGNOSIS — E785 Hyperlipidemia, unspecified: Secondary | ICD-10-CM

## 2019-03-14 NOTE — Assessment & Plan Note (Signed)
Improved HTN control Complicated by CAD    Plan:  1. Continue current regimen - Losartan 50mg  daily, Metoprolol XL 50mg  daily 2. Encourage improved lifestyle - low sodium diet, regular exercise 3. Keep monitor BP outside office, bring readings to next visit, if persistently >140/90 or new symptoms notify office sooner - her daughter is in training for nurse

## 2019-03-14 NOTE — Assessment & Plan Note (Signed)
Still elevated TSH, 7 from previous 8 Suboptimal control  Plan Increase levothyroxine from 25 up to 10mcg - double current dose, no new rx sent yet, notify when need Repeat thyroid studies in 3 months with other labs

## 2019-03-14 NOTE — Assessment & Plan Note (Signed)
Resolved s/p surgical removal by Dr Tami Ribas ENT 01/2019

## 2019-03-14 NOTE — Patient Instructions (Addendum)
Thank you for coming to the office today.  Recent Labs    12/02/18 0833 03/07/19 0809  HGBA1C 9.2* 8.3*   Keep up the plan as discussed, to reduce sugar in sweet tea, and also eliminate sodas from diet, keep reducing carbs / starches.  Continue improving water intake.  Try to do more walking exercise.  Can increase Metformin 539m to TWO in morning or TWO in evening, and then ONE the other dose, for 3 a day, then eventually you can work towards MAX dose of 4 pills in 24 hours, TWO and TWO.  We can consider other medications in future if needed.  Cholesterol significantly improved. Great job, continue on cholesterol medicine.  Thyroid hormone is still low in the body, we can increase Levothyroxine 227m up to 5036m- so double up, in morning on this pill, notify if need new rx.  Call JanMerlene MorseEliGrayland Ormondck to help coordinate   DUE for FASTING BLOOD WORK (no food or drink after midnight before the lab appointment, only water or coffee without cream/sugar on the morning of)  SCHEDULE "Lab Only" visit in the morning at the clinic for lab draw in 3 MONTHS   - Make sure Lab Only appointment is at about 1 week before your next appointment, so that results will be available  For Lab Results, once available within 2-3 days of blood draw, you can can log in to MyChart online to view your results and a brief explanation. Also, we can discuss results at next follow-up visit.   -------------------------------  Your provider would like to you have your annual eye exam. Please contact your current eye doctor or here are some good options for you to contact.   [facility]Address: [address] [id number]: [phone]: [email or web address] City[address] Edgefield StreeturRockaway BeachC [id number]: [phone]  [facility] At[facility]aderoddress: [address] [place] [address] [id number]: [phone],[place]one: [phone] Physicians [facility] D[facility] Ballantynedress: 310[place]aur[place] [address]hone: [phone] Mammogram screening for breast cancer   Call the [place] anytime to schedule your own appointment now that order has been placed.  [facility]4[place] [address]one: [phone] Cancer Screening: - For all adults age 88+30+utine colon cancer screening is highly recommended.     - Recent guidelines from [place] starting age of 45 41Early detection of colon cancer is important, because often there are no warning signs or symptoms, also if found early usually it can be cured. Late stage is hard to treat.  - If you are not interested in Colonoscopy screening (if done and normal you could be cleared for 5 to 10 years until next due), then Cologuard is an excellent alternative for screening test for Colon Cancer. It is highly sensitive for detecting DNA of colon cancer from even the earliest stages. Also, there is NO bowel prep required. - If Cologuard is NEGATIVE, then it is good for 3 years before next due - If Cologuard is POSITIVE, then it is strongly advised to get a Colonoscopy, which allows the GI doctor to locate the source of the cancer or polyp (even very early stage) and treat  it by removing it. ------------------------- If you would like to proceed with Cologuard (stool DNA test) - FIRST, call your insurance company and tell them you want to check cost of Cologuard tell them CPT Code [phone] (it may be completely covered and you could get for no cost, OR max cost without any coverage is about $600). Also, keep in mind if you do NOT open the kit, and decide not to do the test, you will NOT be charged, you should contact the company if you decide not to do the test. - If you want to  proceed, you can notify us (phone message, [place], or at next visit) and we will order it for you. The test kit will be delivered to you house within about 1 week. Follow instructions to collect sample, you may call the company for any help or questions, 24/7 telephone support at [phone].     Please schedule a Follow-up Appointment to: Return in about 3 months (around 06/14/2019) for DM, Thyroid, lab results.  If you have any other questions or concerns, please feel free to call the office or send a message through [place]. You may also schedule an earlier appointment if necessary.  Additionally, you may be receiving a survey about your experience at our office within a few days to 1 week by e-mail or mail. We value your feedback.  Nobie Putnam, DO [place]

## 2019-03-14 NOTE — Progress Notes (Signed)
Subjective:    Patient ID: Morgan Wright, female    DOB: 05-Aug-1966, 53 y.o.   MRN: [id number]  Morgan Wright is a 53 y.o. female presenting on 03/14/2019 for Hypertension (follow up after lab work), Diabetes, and Hypothyroidism   HPI   New Type 2 Diabetes diagnosis Interval update last lab showed A1c 8.3, from previous 9.2 Unable to reach CCM nursing by phone due to work hours, but she plans to contact them to help learn about her diabetes, they sent her packet of diet info CBGs: Not checking Meds: Metformin 500mg  twice daily, had some GI intolerance upset stomach initially now improved. Currently on ARB Lifestyle: - Diet (not following DM diet - she has tried to improve but not always following a specific diet,  - Exercise (limited currently) Denies hypoglycemia, polyuria, visual changes, numbness or tingling.  CHRONIC HTN:  No new concerns. Current Meds - Losartan 50mg  daily, Metoprolol XL succinate 50mg  daily Denies CP, dyspnea, HA, edema, dizziness / lightheadedness  Hypothyroidism Prior historyof low thyroid hormone, TSH had been 8 now recent check around 7, still elevated Continues Levothyroxine 25mcg daily, she is open to considering dose increase, never on higher dose  HYPERLIPIDEMIA: Known CAD and history of HLD, since previous apt has been started back on Statin Interval lab result 02/2019 dramatic improvement LDL 180 down to 80 - Today reports taking Rosuvastatin 40mg  daily, and tolerating it well without myalgia  Follow-up Chronic Hoarseness Voice / Laryngitis S/p Microlaryngoscopy with vocal cord polyp [facility] 01[facility]28/19 [facility], uncertain cause for polyp, but removed large polyp obstructing her vocal cords. She has had significant improvement in her voice quality now, and she is scheduled on 05/17/19 to follow-up  Depression screen [facility] 2/9 03/14/2019 12/24/2018 11/26/2018  Decreased Interest 0 0 0  Down, Depressed, Hopeless 0 0 0  PHQ - 2  Score 0 0 0    Social History   Tobacco Use  . Smoking status: Current Every Day Smoker    Packs/day: 0.75    Years: 40.00    Pack years: 30.00    Types: Cigarettes  . Smokeless tobacco: Never Used  Substance Use Topics  . Alcohol use: No  . Drug use: Never    Review of Systems Per HPI unless specifically indicated above     Objective:    BP 132/74   Pulse 75   Temp 99 F (37.2 C) (Oral)   Resp 16   Ht 5\' 1"  (1.549 m)   Wt 182 lb (82.6 kg)   BMI 34.39 kg/m   Wt Readings from Last 3 Encounters:  03/14/19 182 lb (82.6 kg)  01/28/19 181 lb (82.1 kg)  11/26/18 186 lb 6.4 oz (84.6 kg)    Physical Exam Vitals signs and nursing note reviewed.  Constitutional:      General: She is not in acute distress.    Appearance: She is well-developed. She is not diaphoretic.     Comments: Well-appearing, comfortable, cooperative  HENT:     Head: Normocephalic and atraumatic.  Eyes:     General:        Right eye: No discharge.        Left eye: No discharge.     Conjunctiva/sclera: Conjunctivae normal.  Cardiovascular:     Rate and Rhythm: Normal rate.  Pulmonary:     Effort: Pulmonary effort is normal.  Skin:    General: Skin is warm and dry.     Findings:  No erythema or rash.  Neurological:     Mental Status: She is alert and oriented to person, place, and time.  Psychiatric:        Behavior: Behavior normal.     Comments: Well groomed, good eye contact, normal speech and thoughts      Diabetic Foot Exam - Simple   Simple Foot Form Diabetic Foot exam was performed with the following findings: Yes 03/14/2019  3:43 PM  Visual Inspection See comments: Yes Sensation Testing Intact to touch and monofilament testing bilaterally: Yes Pulse Check Posterior Tibialis and Dorsalis pulse intact bilaterally: Yes Comments Right great toe callus formation, few other areas including heels. No ulceration or other deformity.    Recent Labs    12/02/18 0833 03/07/19 0809   HGBA1C 9.2* 8.3*      Results for orders placed or performed in visit on 03/07/19  T4, free  Result Value Ref Range   Free T4 0.9 0.8 - 1.8 ng/dL  TSH  Result Value Ref Range   TSH 7.85 (H) mIU/L  Lipid panel  Result Value Ref Range   Cholesterol 145 <200 mg/dL   HDL 32 (L) > OR = 50 mg/dL   Triglycerides 829175 (H) <150 mg/dL   LDL Cholesterol (Calc) 86 mg/dL (calc)   Total CHOL/HDL Ratio 4.5 <5.0 (calc)   Non-HDL Cholesterol (Calc) 113 <130 mg/dL (calc)  COMPLETE METABOLIC PANEL WITH GFR  Result Value Ref Range   Glucose, Bld 173 (H) 65 - 99 mg/dL   BUN 10 7 - 25 mg/dL   Creat 5.620.69 1.300.50 - 8.651.05 mg/dL   GFR, Est Non African American 100 > OR = 60 mL/min/1.6273m2   GFR, Est African American 116 > OR = 60 mL/min/1.5673m2   BUN/Creatinine Ratio NOT APPLICABLE 6 - 22 (calc)   Sodium 139 135 - 146 mmol/L   Potassium 4.3 3.5 - 5.3 mmol/L   Chloride 101 98 - 110 mmol/L   CO2 28 20 - 32 mmol/L   Calcium 9.7 8.6 - 10.4 mg/dL   Total Protein 6.5 6.1 - 8.1 g/dL   Albumin 4.0 3.6 - 5.1 g/dL   Globulin 2.5 1.9 - 3.7 g/dL (calc)   AG Ratio 1.6 1.0 - 2.5 (calc)   Total Bilirubin 0.4 0.2 - 1.2 mg/dL   Alkaline phosphatase (APISO) 72 37 - 153 U/L   AST 11 10 - 35 U/L   ALT 14 6 - 29 U/L  Hemoglobin A1c  Result Value Ref Range   Hgb A1c MFr Bld 8.3 (H) <5.7 % of total Hgb   Mean Plasma Glucose 192 (calc)   eAG (mmol/L) 10.6 (calc)      Assessment & Plan:   Problem List Items Addressed This Visit    Acquired hypothyroidism    Still elevated TSH, 7 from previous 8 Suboptimal control  Plan Increase levothyroxine from 25 up to 50mcg - double current dose, no new rx sent yet, notify when need Repeat thyroid studies in 3 months with other labs      Relevant Medications   levothyroxine (SYNTHROID) 50 MCG tablet   Essential hypertension    Improved HTN control Complicated by CAD    Plan:  1. Continue current regimen - Losartan 50mg  daily, Metoprolol XL 50mg  daily 2. Encourage  improved lifestyle - low sodium diet, regular exercise 3. Keep monitor BP outside office, bring readings to next visit, if persistently >140/90 or new symptoms notify office sooner - her daughter is in training for nurse  Hyperlipidemia associated with type 2 diabetes mellitus ([place])    SIgnificantly improved LDL cholesterol from 180 down to 80 on statin Last lipid panel 02/2019 Known CAD  Plan: 1. Continue current meds rx since last visit - Rosuvastatin 40mg  daily 2. Encourage improved lifestyle - low carb/cholesterol, reduce portion size, continue improving regular exercise      Type 2 diabetes mellitus with other specified complication (HCC) - Primary    Confirm new dx T2DM with A1c 8.3 still elevated above 6.5 on repeat check With  Hyperglycemia Complications - hyperlipidemia obesity, hypothyroidism, CAD - increases risk of future cardiovascular complications   Plan:  1. May increase Metformin from 500 BID up to 1000mg  BID or can try 1000 / 500 dosing as well if tolerated - Defer new DM agents for now, will keep trying to maximize lifestyle and current med, future reconsider GLP1 SGLT2 2. Encourage improved lifestyle - low carb, low sugar diet, reduce portion size, continue improving regular exercise - quit sodas and reduce sugar in tea 3. Check CBG , bring log to next visit for review 4. Continue ARB, Statin - DM Foot exam today, due DM Eye exam [facility]  Patient to reconnect with CCM referral Nurse / pharmacy for assistance with Diabetes, has DM diet resources Repeat A1c with labs in 3 months      Vocal cord polyp    Resolved s/p surgical removal by Dr Tami Ribas ENT 01/2019       Other Visit Diagnoses    Need for 23-polyvalent pneumococcal polysaccharide vaccine       Relevant Orders   Pneumococcal polysaccharide vaccine 23-valent greater than or equal to 2yo subcutaneous/IM (Completed)   Screening for breast cancer       Relevant Orders   MM DIGITAL SCREENING  BILATERAL      Health maintenance Consider Cologuard - handout given, call us if ready to order Ordered Mammogram, she will call to schedule S/p PNAvax 23 today  Orders Placed This Encounter  Procedures  . MM DIGITAL SCREENING BILATERAL    Standing Status:   Future    Standing Expiration Date:   05/14/2020    Order Specific Question:   Reason for Exam (SYMPTOM  OR DIAGNOSIS REQUIRED)    Answer:   Routine screening bilateral mammogram. May change to Bilateral MM [place] screening if indicated and appropriate for patient/insurance    Order Specific Question:   Preferred imaging location?    Answer:   [facility]  . Pneumococcal polysaccharide vaccine 23-valent greater than or equal to 2yo subcutaneous/IM    No orders of the defined types were placed in this encounter.   Follow up plan: Return in about 3 months (around 06/14/2019) for DM, Thyroid, lab results.  Future labs ordered for A1c / TSH Free T4 06/10/19  Morgan Putnam, DO [facility] 03/14/2019, 3:20 PM

## 2019-03-14 NOTE — Assessment & Plan Note (Signed)
SIgnificantly improved LDL cholesterol from 180 down to 80 on statin Last lipid panel 02/2019 Known CAD  Plan: 1. Continue current meds rx since last visit - Rosuvastatin 40mg  daily 2. Encourage improved lifestyle - low carb/cholesterol, reduce portion size, continue improving regular exercise

## 2019-03-14 NOTE — Assessment & Plan Note (Addendum)
Confirm new dx T2DM with A1c 8.3 still elevated above 6.5 on repeat check With  Hyperglycemia Complications - hyperlipidemia obesity, hypothyroidism, CAD - increases risk of future cardiovascular complications   Plan:  1. May increase Metformin from 500 BID up to 1000mg  BID or can try 1000 / 500 dosing as well if tolerated - Defer new DM agents for now, will keep trying to maximize lifestyle and current med, future reconsider GLP1 SGLT2 2. Encourage improved lifestyle - low carb, low sugar diet, reduce portion size, continue improving regular exercise - quit sodas and reduce sugar in tea 3. Check CBG , bring log to next visit for review 4. Continue ARB, Statin - DM Foot exam today, due DM Eye exam [facility]  Patient to reconnect with CCM referral Nurse / pharmacy for assistance with Diabetes, has DM diet resources Repeat A1c with labs in 3 months

## 2019-04-18 ENCOUNTER — Telehealth: Payer: Self-pay

## 2019-04-18 ENCOUNTER — Ambulatory Visit: Payer: Self-pay | Admitting: *Deleted

## 2019-04-18 DIAGNOSIS — E1169 Type 2 diabetes mellitus with other specified complication: Secondary | ICD-10-CM

## 2019-04-18 NOTE — Chronic Care Management (AMB) (Signed)
  Chronic Care Management   Outreach Note  04/18/2019 Name: Morgan Wright MRN: [id number] DOB: [birth year]  Referred by: Olin Hauser, DO Reason for referral : Chronic Care Management (Unsuccessful outreach x3)   Third unsuccessful telephone outreach was attempted today. The patient was referred to the case management team for assistance with chronic care management and care coordination. The patient's primary care provider has been notified of our unsuccessful attempts to make or maintain contact with the patient. The care management team is pleased to engage with this patient at any time in the future should he/she be interested in assistance from the care management team.   Follow Up Plan: The patient has been provided with contact information for the care management team and has been advised to call with any health related questions or concerns. PCP has encouraged patient to return CCM calls. Will be glad to speak with patient when she is able to  returns my calls.  Merlene Morse Helene Bernstein RN, BSN Nurse Case Pharmacist, [facility]  [phone]) Business Mobile

## 2019-05-17 ENCOUNTER — Other Ambulatory Visit: Payer: Self-pay | Admitting: Family Medicine

## 2019-05-17 DIAGNOSIS — E039 Hypothyroidism, unspecified: Secondary | ICD-10-CM

## 2019-05-17 MED ORDER — LEVOTHYROXINE SODIUM 50 MCG PO TABS: 50.0000 ug | ORAL_TABLET | Freq: Every day | ORAL | 1 refills | Status: DC

## 2019-05-18 ENCOUNTER — Other Ambulatory Visit: Payer: Self-pay | Admitting: *Deleted

## 2019-05-18 DIAGNOSIS — Z20822 Contact with and (suspected) exposure to covid-19: Secondary | ICD-10-CM

## 2019-05-18 DIAGNOSIS — R6889 Other general symptoms and signs: Secondary | ICD-10-CM | POA: Diagnosis not present

## 2019-05-19 LAB — NOVEL CORONAVIRUS, NAA: SARS-CoV-2, NAA: NOT DETECTED

## 2019-06-10 ENCOUNTER — Other Ambulatory Visit: Payer: BC Managed Care – PPO

## 2019-06-15 ENCOUNTER — Ambulatory Visit: Payer: BC Managed Care – PPO | Admitting: Family Medicine

## 2019-06-29 DIAGNOSIS — E039 Hypothyroidism, unspecified: Secondary | ICD-10-CM | POA: Diagnosis not present

## 2019-06-29 DIAGNOSIS — E1169 Type 2 diabetes mellitus with other specified complication: Secondary | ICD-10-CM | POA: Diagnosis not present

## 2019-06-30 ENCOUNTER — Other Ambulatory Visit: Payer: BC Managed Care – PPO

## 2019-06-30 LAB — T4, FREE: Free T4: 1 ng/dL (ref 0.8–1.8)

## 2019-06-30 LAB — HEMOGLOBIN A1C
Hgb A1c MFr Bld: 7.6 % of total Hgb — ABNORMAL HIGH (ref <5.7)
Mean Plasma Glucose: 171 (calc)
eAG (mmol/L): 9.5 (calc)

## 2019-06-30 LAB — TSH: TSH: 6.81 mIU/L — ABNORMAL HIGH

## 2019-07-06 ENCOUNTER — Ambulatory Visit: Payer: BC Managed Care – PPO | Admitting: Family Medicine

## 2019-08-10 ENCOUNTER — Encounter: Payer: Self-pay | Admitting: Emergency Medicine

## 2019-08-10 ENCOUNTER — Other Ambulatory Visit: Payer: Self-pay

## 2019-08-10 ENCOUNTER — Ambulatory Visit
Admission: EM | Admit: 2019-08-10 | Discharge: 2019-08-10 | Disposition: A | Discharge status: Home / Self Care | Payer: BC Managed Care – PPO | Attending: Family Medicine | Admitting: Family Medicine

## 2019-08-10 DIAGNOSIS — K529 Noninfective gastroenteritis and colitis, unspecified: Secondary | ICD-10-CM | POA: Diagnosis not present

## 2019-08-10 MED ORDER — ONDANSETRON 8 MG PO TBDP: 8.0000 mg | ORAL_TABLET | Freq: Three times a day (TID) | ORAL | 0 refills | Status: DC | PRN

## 2019-08-10 NOTE — Discharge Instructions (Signed)
Rest, fluids. 

## 2019-08-10 NOTE — ED Triage Notes (Signed)
Patient states she was out of work Monday and Tuesday with diarrhea. She states she returned to work this morning and was told she has a fever.

## 2019-08-14 NOTE — ED Provider Notes (Signed)
[facility]    CSN: [id number] Arrival date & time: 08/10/19  1009      History   Chief Complaint Chief Complaint  Patient presents with  . Diarrhea    HPI Morgan Wright is a 53 y.o. female.   53 yo female with a c/o "told at work that I had a fever". Patient states that earlier in the week she had diarrhea with nausea and stayed home from work those days, however states symptoms resolved. States she feels fine now. No complaints. Patient's temperature here is normal.    Diarrhea   Past Medical History:  Diagnosis Date  . Acquired hypothyroidism 11/26/2018  . Coronary artery disease   . Coronary artery disease involving native coronary artery of native heart without angina pectoris 11/26/2018  . Essential hypertension 11/26/2018  . Heart murmur   . Hepatitis   . History of hepatitis C 11/26/2018  . Mixed hyperlipidemia 11/26/2018  . 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 Type 2 diabetes mellitus with other specified complication ([place]) 25/42/7062  . Essential hypertension 11/26/2018  . Hyperlipidemia associated with type 2 diabetes mellitus ([place]) 11/26/2018  . Coronary artery disease involving native coronary artery of native heart without angina pectoris 11/26/2018  . 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 Acquired hypothyroidism 11/26/2018  . Hoarseness of voice 11/26/2018  . Cigarette nicotine dependence without complication 37/62/8315  . History of hepatitis C 11/26/2018  . Morbid obesity ([place]) 11/26/2018    Past Surgical History:  Procedure Laterality Date  . CARDIAC SURGERY    . CESAREAN SECTION     x 3 - [id number]  . MICROLARYNGOSCOPY W/VOCAL CORD INJECTION Bilateral 01/28/2019   Procedure: MICROLARYNGOSCOPY WITH VOCAL CORD EXCISION;  Surgeon: Beverly Gust, MD;  Location: [place];  Service: ENT;  Laterality: Bilateral;  . TONSILLECTOMY      OB History   No obstetric  history on file.      Home Medications    Prior to Admission medications   Medication Sig Start Date End Date Taking? Authorizing Provider  levothyroxine (SYNTHROID) 50 MCG tablet Take 1 tablet (50 mcg total) by mouth daily before breakfast. 05/17/19  Yes Karamalegos, Devonne Doughty, DO  losartan (COZAAR) 50 MG tablet Take 1 tablet (50 mg total) by mouth daily. 11/26/18  Yes Karamalegos, Devonne Doughty, DO  metFORMIN (GLUCOPHAGE) 500 MG tablet Take 1 tablet (500 mg total) by mouth 2 (two) times daily with a meal. Start with 1 tablet in PM with meal, then increase as advise. 12/24/18  Yes Karamalegos, Devonne Doughty, DO  metoprolol succinate (TOPROL-XL) 50 MG 24 hr tablet Take 1 tablet (50 mg total) by mouth daily. Take with or immediately following a meal. 11/26/18  Yes Karamalegos, Devonne Doughty, DO  rosuvastatin (CRESTOR) 40 MG tablet Take 1 tablet (40 mg total) by mouth at bedtime. 12/09/18  Yes Karamalegos, Alexander J, DO  CHANTIX STARTING MONTH PAK 0.5 MG X 11 & 1 MG X 42 tablet  11/24/18   [provider]  ondansetron (ZOFRAN ODT) 8 MG disintegrating tablet Take 1 tablet (8 mg total) by mouth every 8 (eight) hours as needed. 08/10/19   Norval Gable, MD    Family History Family History  Problem Relation Age of Onset  . Heart disease Mother   . Anuerysm Father 5  . Lymphoma Sister        burkit  Social History Social History   Tobacco Use  . Smoking status: Current Every Day Smoker    Packs/day: 0.75    Years: 40.00    Pack years: 30.00    Types: Cigarettes  . Smokeless tobacco: Never Used  Substance Use Topics  . Alcohol use: No  . Drug use: Never     Allergies   Patient has no known allergies.   Review of Systems Review of Systems  Gastrointestinal: Positive for diarrhea.     Physical Exam Triage Vital Signs ED Triage Vitals  Enc Vitals Group     BP 08/10/19 1044 (!) 200/89     Pulse Rate 08/10/19 1042 87     Resp 08/10/19 1042 18     Temp 08/10/19 1042  98.2 F (36.8 C)     Temp Source 08/10/19 1042 Oral     SpO2 08/10/19 1042 97 %     Weight 08/10/19 1043 180 lb (81.6 kg)     Height 08/10/19 1043 5\' 1"  (1.549 m)     Head Circumference --      Peak Flow --      Pain Score 08/10/19 1043 0     Pain Loc --      Pain Edu? --      Excl. in GC? --    No data found.  Updated Vital Signs BP (!) 200/89 Comment: patient forgot her BP medication  Pulse 87   Temp 98.2 F (36.8 C) (Oral)   Resp 18   Ht 5\' 1"  (1.549 m)   Wt 81.6 kg   SpO2 97%   BMI 34.01 kg/m   Visual Acuity Right Eye Distance:   Left Eye Distance:   Bilateral Distance:    Right Eye Near:   Left Eye Near:    Bilateral Near:     Physical Exam Vitals and nursing note reviewed.  Constitutional:      General: She is not in acute distress.    Appearance: She is not toxic-appearing or diaphoretic.  Cardiovascular:     Rate and Rhythm: Normal rate.  Pulmonary:     Effort: Pulmonary effort is normal. No respiratory distress.  Abdominal:     General: Bowel sounds are normal. There is no distension.     Palpations: Abdomen is soft. There is no mass.     Tenderness: There is no abdominal tenderness. There is no right CVA tenderness, left CVA tenderness, guarding or rebound.     Hernia: No hernia is present.  Neurological:     Mental Status: She is alert.      UC Treatments / Results  Labs (all labs ordered are listed, but only abnormal results are displayed) Labs Reviewed - No data to display  EKG   Radiology No results found.  Procedures Procedures (including critical care time)  Medications Ordered in UC Medications - No data to display  Initial Impression / Assessment and Plan / UC Course  I have reviewed the triage vital signs and the nursing notes.  Pertinent labs & imaging results that were available during my care of the patient were reviewed by me and considered in my medical decision making (see chart for details).      Final Clinical  Impressions(s) / UC Diagnoses   Final diagnoses:  Gastroenteritis     Discharge Instructions     Rest, fluids    ED Prescriptions    Medication Sig Dispense Auth. Provider   ondansetron (ZOFRAN ODT) 8 MG disintegrating tablet  Take 1 tablet (8 mg total) by mouth every 8 (eight) hours as needed. 6 tablet Payton Mccallumonty, Erol Flanagin, MD      1. diagnosis reviewed with patient 2. rx as per orders above; reviewed possible side effects, interactions, risks and benefits  3. Recommend supportive treatment as above  4. Follow-up prn if symptoms worsen or don't improve   PDMP not reviewed this encounter.   Payton Mccallumonty, Augustine Leverette, MD 08/14/19 [phone]

## 2021-03-04 ENCOUNTER — Ambulatory Visit: Payer: Self-pay | Admitting: *Deleted

## 2021-03-04 NOTE — Telephone Encounter (Signed)
Reason for Disposition  [1] Numbness (i.e., loss of sensation) of the face, arm / hand, or leg / foot on one side of the body AND [2] sudden onset AND [3] brief (now gone)  Answer Assessment - Initial Assessment Questions 1. SYMPTOM: "What is the main symptom you are concerned about?" (e.g., weakness, numbness)     Pain in right arm N/T to shoulder and now in right side of mouth and lip 2. ONSET: "When did this start?" (minutes, hours, days; while sleeping)     N/T in right arm on Tuesday or Wednesday last week. Mouth and lip constant now  3. LAST NORMAL: "When was the last time you (the patient) were normal (no symptoms)?"     Monday last week.11/26/20 4. PATTERN "Does this come and go, or has it been constant since it started?"  "Is it present now?"     Constant now  5. CARDIAC SYMPTOMS: "Have you had any of the following symptoms: chest pain, difficulty breathing, palpitations?"     No  6. NEUROLOGIC SYMPTOMS: "Have you had any of the following symptoms: headache, dizziness, vision loss, double vision, changes in speech, unsteady on your feet?"     Right arm and mouth and lip N/T 7. OTHER SYMPTOMS: "Do you have any other symptoms?"     No  8. PREGNANCY: "Is there any chance you are pregnant?" "When was your last menstrual period?"     na  Protocols used: Neurologic Deficit-A-AH

## 2021-03-04 NOTE — Telephone Encounter (Signed)
C/o pain in right arm N/T in right arm to shoulder since Tuesday or Wednesday last week. Mouth and lips on right side 2-3 times felt N/T over the weekend now feels more constant. Denies chest pain, difficulty breathing. No blurred vision, headache, slurred speech, no decrease in balance . Instructed patient to go to ED now. Patient reports she is to pick up grandchild from day care. Instructed patient not to drive and to have someone else pick up child. Patient reports if symptoms worsen she will call 911 or go to ED. Expressed importance for fast medication evaluation. Patient reports she will call if needed. Care advise given. Patient verbalized understanding of care advise and to go to ED now or call 911 if symptoms worsen.

## 2021-10-29 ENCOUNTER — Ambulatory Visit (INDEPENDENT_AMBULATORY_CARE_PROVIDER_SITE_OTHER): Payer: BC Managed Care – PPO

## 2021-10-29 ENCOUNTER — Ambulatory Visit
Admission: EM | Admit: 2021-10-29 | Discharge: 2021-10-29 | Disposition: A | Discharge status: Home / Self Care | Payer: BC Managed Care – PPO | Attending: Emergency Medicine | Admitting: Emergency Medicine

## 2021-10-29 ENCOUNTER — Other Ambulatory Visit: Payer: Self-pay

## 2021-10-29 DIAGNOSIS — D582 Other hemoglobinopathies: Secondary | ICD-10-CM | POA: Diagnosis not present

## 2021-10-29 DIAGNOSIS — R059 Cough, unspecified: Secondary | ICD-10-CM | POA: Diagnosis not present

## 2021-10-29 DIAGNOSIS — R509 Fever, unspecified: Secondary | ICD-10-CM | POA: Diagnosis not present

## 2021-10-29 DIAGNOSIS — B349 Viral infection, unspecified: Secondary | ICD-10-CM | POA: Diagnosis not present

## 2021-10-29 DIAGNOSIS — E871 Hypo-osmolality and hyponatremia: Secondary | ICD-10-CM | POA: Diagnosis not present

## 2021-10-29 DIAGNOSIS — Z951 Presence of aortocoronary bypass graft: Secondary | ICD-10-CM | POA: Diagnosis not present

## 2021-10-29 DIAGNOSIS — Z20822 Contact with and (suspected) exposure to covid-19: Secondary | ICD-10-CM | POA: Diagnosis not present

## 2021-10-29 DIAGNOSIS — E1165 Type 2 diabetes mellitus with hyperglycemia: Secondary | ICD-10-CM | POA: Insufficient documentation

## 2021-10-29 LAB — COMPREHENSIVE METABOLIC PANEL
ALT: 12 U/L (ref 0–44)
AST: 19 U/L (ref 15–41)
Albumin: 3.9 g/dL (ref 3.5–5.0)
Alkaline Phosphatase: 56 U/L (ref 38–126)
Anion gap: 10 (ref 5–15)
BUN: 12 mg/dL (ref 6–20)
CO2: 24 mmol/L (ref 22–32)
Calcium: 8.9 mg/dL (ref 8.9–10.3)
Chloride: 96 mmol/L — ABNORMAL LOW (ref 98–111)
Creatinine, Ser: 0.93 mg/dL (ref 0.44–1.00)
GFR, Estimated: >60 mL/min (ref >60)
Glucose, Bld: 151 mg/dL — ABNORMAL HIGH (ref 70–99)
Potassium: 4.5 mmol/L (ref 3.5–5.1)
Sodium: 130 mmol/L — ABNORMAL LOW (ref 135–145)
Total Bilirubin: 0.4 mg/dL (ref 0.3–1.2)
Total Protein: 7.6 g/dL (ref 6.5–8.1)

## 2021-10-29 LAB — CBC WITH DIFFERENTIAL/PLATELET
Abs Immature Granulocytes: 0.04 10*3/uL (ref 0.00–0.07)
Basophils Absolute: 0.1 10*3/uL (ref 0.0–0.1)
Basophils Relative: 1 %
Eosinophils Absolute: 0.1 10*3/uL (ref 0.0–0.5)
Eosinophils Relative: 1 %
HCT: 43.6 % (ref 36.0–46.0)
Hemoglobin: 15.1 g/dL — ABNORMAL HIGH (ref 12.0–15.0)
Immature Granulocytes: 0 %
Lymphocytes Relative: 16 %
Lymphs Abs: 1.5 10*3/uL (ref 0.7–4.0)
MCH: 29.7 pg (ref 26.0–34.0)
MCHC: 34.6 g/dL (ref 30.0–36.0)
MCV: 85.7 fL (ref 80.0–100.0)
Monocytes Absolute: 0.7 10*3/uL (ref 0.1–1.0)
Monocytes Relative: 7 %
Neutro Abs: 6.8 10*3/uL (ref 1.7–7.7)
Neutrophils Relative %: 75 %
Platelets: 225 10*3/uL (ref 150–400)
RBC: 5.09 MIL/uL (ref 3.87–5.11)
RDW: 13.3 % (ref 11.5–15.5)
WBC: 9.1 10*3/uL (ref 4.0–10.5)
nRBC: 0 % (ref 0.0–0.2)

## 2021-10-29 LAB — URINALYSIS, ROUTINE W REFLEX MICROSCOPIC
Bilirubin Urine: NEGATIVE
Glucose, UA: 500 mg/dL — AB
Hgb urine dipstick: NEGATIVE
Ketones, ur: NEGATIVE mg/dL
Leukocytes,Ua: NEGATIVE
Nitrite: NEGATIVE
Protein, ur: 30 mg/dL — AB
Specific Gravity, Urine: 1.015 (ref 1.005–1.030)
pH: 7 (ref 5.0–8.0)

## 2021-10-29 LAB — RESP PANEL BY RT-PCR (FLU A&B, COVID) ARPGX2
Influenza A by PCR: NEGATIVE
Influenza B by PCR: NEGATIVE
SARS Coronavirus 2 by RT PCR: NEGATIVE

## 2021-10-29 LAB — GLUCOSE, CAPILLARY: Glucose-Capillary: 168 mg/dL — ABNORMAL HIGH (ref 70–99)

## 2021-10-29 LAB — URINALYSIS, MICROSCOPIC (REFLEX)

## 2021-10-29 MED ORDER — AEROCHAMBER MV MISC: 2 refills | Status: AC

## 2021-10-29 MED ORDER — BENZONATATE 100 MG PO CAPS: 200.0000 mg | ORAL_CAPSULE | Freq: Three times a day (TID) | ORAL | 0 refills | Status: DC

## 2021-10-29 MED ORDER — ALBUTEROL SULFATE HFA 108 (90 BASE) MCG/ACT IN AERS: 2.0000 | INHALATION_SPRAY | RESPIRATORY_TRACT | 0 refills | Status: DC | PRN

## 2021-10-29 MED ORDER — IPRATROPIUM BROMIDE 0.06 % NA SOLN: 2.0000 | Freq: Four times a day (QID) | NASAL | 12 refills | Status: DC

## 2021-10-29 NOTE — ED Triage Notes (Signed)
Patient is here for "ha" that started "Sunday evening" then "cold chills now". Recent urinary accident "came out before I could get there" then urinary urgency, frequency. Stools "normal'. Fatigue.  ?

## 2021-10-29 NOTE — ED Provider Notes (Signed)
[facility]    CSN: [id number] Arrival date & time: 10/29/21  0940      History   Chief Complaint Chief Complaint  Patient presents with   Chills   Headache    HPI Morgan Wright is a 56 y.o. female.   HPI  56 year old female here for evaluation of motor complaints.  Patient reports that 2 days ago she developed a headache followed by cold chills and a fever with a Tmax of 100.  Patient had an episode of urinary incontinence followed by some urgency and frequency of urination.  Patient also reports runny nose, nasal congestion, nonproductive cough, shortness of breath, and nausea.  She denies any pain with urination, ear pain, sore throat, wheezing, vomiting, diarrhea, or nasal discharge.  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   Acquired hypothyroidism 11/26/2018   Hoarseness of voice 11/26/2018   Cigarette nicotine dependence without complication 11/26/2018   History of hepatitis C 11/26/2018   Morbid obesity ([facility]) 11/26/2018   Essential hypertension 11/24/2018   Hyperlipidemia, mixed 11/24/2018   Coronary artery disease involving native coronary artery of native heart without angina pectoris 11/24/2018   Tobacco abuse 11/24/2018    Past Surgical History:  Procedure Laterality Date   CARDIAC SURGERY     CESAREAN SECTION     x 3 - 1984-1994   MICROLARYNGOSCOPY W/VOCAL CORD INJECTION Bilateral 01/28/2019   Procedure: MICROLARYNGOSCOPY WITH VOCAL  CORD EXCISION;  Surgeon: Linus SalmonsMcQueen, Chapman, MD;  [facility];  Service: ENT;  Laterality: Bilateral;   TONSILLECTOMY      OB History   No obstetric history on file.      Home Medications    Prior to Admission medications   Medication Sig Start Date End Date Taking? Authorizing Provider  albuterol (VENTOLIN HFA) 108 (90 Base) MCG/ACT inhaler Inhale 2 puffs into the lungs every 4 (four) hours as needed. 10/29/21  Yes Becky Augustayan, Cove Haydon, NP  benzonatate (TESSALON) 100 MG capsule Take 2 capsules (200 mg total) by mouth every 8 (eight) hours. 10/29/21  Yes Becky Augustayan, Erling Arrazola, NP  ipratropium (ATROVENT) 0.06 % nasal spray Place 2 sprays into both nostrils 4 (four) times daily. 10/29/21  Yes Becky Augustayan, Deriyah Kunath, NP  Spacer/Aero-Holding Chambers (AEROCHAMBER MV) inhaler Use as instructed 10/29/21  Yes Becky Augustayan, Dreyson Mishkin, NP  CHANTIX STARTING MONTH PAK 0.5 MG X 11 & 1 MG X 42 tablet  11/24/18   [provider]  levothyroxine (SYNTHROID) 50 MCG tablet Take 1 tablet (50 mcg total) by mouth daily before breakfast. 05/17/19   Karamalegos, Netta NeatAlexander J, DO  losartan (COZAAR) 50 MG tablet Take 1 tablet (50 mg total) by mouth daily. 11/26/18   Karamalegos, Netta NeatAlexander J, DO  metFORMIN (GLUCOPHAGE) 500 MG tablet Take 1 tablet (500 mg total) by mouth 2 (two) times daily with a meal. Start with 1 tablet in PM with meal, then increase as advise. 12/24/18   Smitty CordsKaramalegos, Alexander J, DO  metoprolol succinate (TOPROL-XL) 50 MG 24 hr tablet Take 1 tablet (50 mg total) by mouth daily. Take with or immediately following a meal. 11/26/18   Karamalegos, Alexander J, DO  ondansetron (ZOFRAN ODT) 8 MG disintegrating tablet Take 1 tablet (8 mg total) by mouth every 8 (eight) hours as needed. 08/10/19   Payton Mccallum, MD  rosuvastatin (CRESTOR) 40 MG tablet Take 1 tablet (40 mg total) by mouth at bedtime. 12/09/18   Karamalegos, Netta Neat, DO    Family History Family History  Problem Relation Age of Onset   Heart disease Mother     Anuerysm Father 80   Lymphoma Sister        [place]    Social History Social History   Tobacco Use   Smoking status: Every Day    Packs/day: 0.75    Years: 40.00    Pack years: 30.00    Types: Cigarettes   Smokeless tobacco: Never  Vaping Use   Vaping Use: Never used  Substance Use Topics   Alcohol use: No   Drug use: Never     Allergies   Patient has no known allergies.   Review of Systems Review of Systems  Constitutional:  Positive for chills and fever.  HENT:  Positive for congestion and rhinorrhea. Negative for ear pain and sore throat.   Respiratory:  Positive for cough and shortness of breath. Negative for wheezing.   Gastrointestinal:  Positive for nausea. Negative for diarrhea and vomiting.  Skin:  Negative for rash.  Neurological:  Positive for headaches.  Hematological: Negative.   Psychiatric/Behavioral: Negative.      Physical Exam Triage Vital Signs ED Triage Vitals  Enc Vitals Group     BP 10/29/21 1044 (!) 167/82     Pulse Rate 10/29/21 1044 95     Resp 10/29/21 1044 20     Temp 10/29/21 1044 99.9 F (37.7 C)     Temp Source 10/29/21 1044 Oral     SpO2 10/29/21 1044 98 %     Weight 10/29/21 1042 160 lb (72.6 kg)     Height --      Head Circumference --      Peak Flow --      Pain Score 10/29/21 1042 0     Pain Loc --      Pain Edu? --      Excl. in GC? --    No data found.  Updated Vital Signs BP (!) 167/82 (BP Location: Left Arm)    Pulse 95    Temp 99.9 F (37.7 C) (Oral)    Resp 20    Wt 160 lb (72.6 kg)    SpO2 98%    BMI 30.23 kg/m   Visual Acuity Right Eye Distance:   Left Eye Distance:   Bilateral Distance:    Right Eye Near:   Left Eye Near:    Bilateral Near:     Physical Exam Vitals and nursing note reviewed.  Constitutional:      Appearance: Normal appearance. She is ill-appearing.  HENT:     Head: Normocephalic and atraumatic.     Right Ear: Tympanic membrane, ear canal and external ear normal. There is no  impacted cerumen.     Left Ear: Tympanic membrane, ear canal and external ear normal. There is no impacted cerumen.     Nose: Congestion and rhinorrhea present.     Mouth/Throat:     Mouth: Mucous membranes are moist.     Pharynx:  Oropharynx is clear. Posterior oropharyngeal erythema present.  Cardiovascular:     Rate and Rhythm: Normal rate and regular rhythm.     Pulses: Normal pulses.     Heart sounds: Normal heart sounds. No murmur heard.   No friction rub. No gallop.  Pulmonary:     Effort: Pulmonary effort is normal.     Breath sounds: Normal breath sounds. No wheezing, rhonchi or rales.  Musculoskeletal:     Cervical back: Normal range of motion and neck supple.  Lymphadenopathy:     Cervical: No cervical adenopathy.  Skin:    General: Skin is warm and dry.     Capillary Refill: Capillary refill takes less than 2 seconds.     Findings: No erythema or rash.  Neurological:     General: No focal deficit present.     Mental Status: She is alert and oriented to person, place, and time.  Psychiatric:        Mood and Affect: Mood normal.        Behavior: Behavior normal.        Thought Content: Thought content normal.        Judgment: Judgment normal.     UC Treatments / Results  Labs (all labs ordered are listed, but only abnormal results are displayed) Labs Reviewed  URINALYSIS, ROUTINE W REFLEX MICROSCOPIC - Abnormal; Notable for the following components:      Result Value   Glucose, UA 500 (*)    Protein, ur 30 (*)    All other components within normal limits  URINALYSIS, MICROSCOPIC (REFLEX) - Abnormal; Notable for the following components:   Bacteria, UA RARE (*)    All other components within normal limits  GLUCOSE, CAPILLARY - Abnormal; Notable for the following components:   Glucose-Capillary 168 (*)    All other components within normal limits  CBC WITH DIFFERENTIAL/PLATELET - Abnormal; Notable for the following components:   Hemoglobin 15.1 (*)    All  other components within normal limits  COMPREHENSIVE METABOLIC PANEL - Abnormal; Notable for the following components:   Sodium 130 (*)    Chloride 96 (*)    Glucose, Bld 151 (*)    All other components within normal limits  RESP PANEL BY RT-PCR (FLU A&B, COVID) ARPGX2  CBG MONITORING, ED    EKG   Radiology DG Chest 2 View  Result Date: 10/29/2021 CLINICAL DATA:  Cough, fever. EXAM: CHEST - 2 VIEW COMPARISON:  None. FINDINGS: The heart size and mediastinal contours are within normal limits. Both lungs are clear. Status post coronary bypass graft. The visualized skeletal structures are unremarkable. IMPRESSION: No active cardiopulmonary disease. Electronically Signed   By: Lupita Raider M.D.   On: 10/29/2021 12:17    Procedures Procedures (including critical care time)  Medications Ordered in UC Medications - No data to display  Initial Impression / Assessment and Plan / UC Course  I have reviewed the triage vital signs and the nursing notes.  Pertinent labs & imaging results that were available during my care of the patient were reviewed by me and considered in my medical decision making (see chart for details).  Patient is a pleasant, though ill-appearing 56 year old female here for evaluation of headache, respiratory, and GU complaints as outlined in HPI above.  All of her symptoms started 2 days ago and have gotten worse.  Patient reports that she feels ill.  He has had runny nose and nasal congestion that started after she took a home  COVID test.  No significant nasal discharge of any kind.  She denies any ear pain or sore throat.  She has had a fever with a Tmax of 100 and a nonproductive cough with some shortness of breath.  She also endorses nausea.  She had no wheezing, vomiting, or diarrhea.  She did have 1 episode of incontinence and she had urinary urgency and frequency without dysuria since.  Respiratory triplex panel and urinalysis were collected at triage and are pending.   Patient's physical exam reveals pearly-gray tympanic membranes bilaterally with normal light reflex and clear external auditory canals.  Nasal mucosa is mildly erythematous without significant edema or discharge.  Oropharyngeal exam reveals mild posterior oropharyngeal erythema without any edema or exudate.  No cervical lymphadenopathy appreciated on exam.  Cardiopulmonary exam reveals clear lung sounds all fields.  Respiratory triplex panel is negative for COVID and influenza.  Urinalysis shows glucose of 500 & 30 of protein but is otherwise unremarkable.  Urine micro shows rare bacteria with 0-5 RBCs, 0-5 WBCs, and 0-5 squamous epithelials.  No evidence of urinary infection.  Due to her glucose urea I will order fingerstick to assess for hyperglycemia.  Patient's fingerstick is 168 and she reports that she has not eaten today so this is a fasting glucose.  We will check CBC, CMP, and chest x-ray.  Chest x-ray independently reviewed and evaluated by me.  Impression lung fields are well pneumatized.  No evidence of effusion or infiltrate.  There are sternotomy wires in place.  Radiology overread is pending. Radiology impression states heart size and mediastinal contours are within normal limits.  Both lungs are clear.  Status post CABG.  Visualized skeletal structures are unremarkable.  No active cardiopulmonary disease.  CBC shows a mildly elevated hemoglobin of 15.5 but is otherwise unremarkable.  CMP shows mild hyponatremia with a sodium of 130.  Potassium is normal at 4.5.  Glucose is only 151.  Renal function and transaminases are normal.  Patient's chest x-ray and lab work are reassuring that there is no presence of infection.  I suspect patient has a viral illness.  Will discharge home to evaluate for new or worsening symptoms.  I will give her Atrovent nasal spray to help with nasal congestion and then albuterol inhaler that she can use as needed for shortness of breath.  Tessalon Perles  as well for cough.   Final Clinical Impressions(s) / UC Diagnoses   Final diagnoses:  Viral illness     Discharge Instructions      Your chest x-ray and lab work were very reassuring that you do not have any signs of pneumonia or bacterial infection.  Urinalysis did not show any signs of infection as well.  I do suspect that you are suffering from a viral illness.  Use the Atrovent nasal spray, 2 squirts up each nostril every 6 hours as needed, for runny nose and nasal congestion.  Use the albuterol inhaler, 2 puffs every 4-6 hours with a spacer, as needed for shortness of breath.  Use the Tessalon Perles every 8 hours during the day as needed for cough.  If develop any new or worsening symptoms please return for reevaluation or see your primary care provider.     ED Prescriptions     Medication Sig Dispense Auth. Provider   benzonatate (TESSALON) 100 MG capsule Take 2 capsules (200 mg total) by mouth every 8 (eight) hours. 21 capsule Becky Augusta, NP   ipratropium (ATROVENT) 0.06 % nasal spray Place  2 sprays into both nostrils 4 (four) times daily. 15 mL Becky Augustayan, Fanny Agan, NP   albuterol (VENTOLIN HFA) 108 (90 Base) MCG/ACT inhaler Inhale 2 puffs into the lungs every 4 (four) hours as needed. 18 g Becky Augustayan, Daenerys Buttram, NP   Spacer/Aero-Holding Chambers (AEROCHAMBER MV) inhaler Use as instructed 1 each Becky Augustayan, Keshawn Sundberg, NP      PDMP not reviewed this encounter.   Becky Augustayan, Aretta Stetzel, NP 10/29/21 [phone]

## 2021-10-29 NOTE — Discharge Instructions (Addendum)
Your chest x-ray and lab work were very reassuring that you do not have any signs of pneumonia or bacterial infection.  Urinalysis did not show any signs of infection as well. ? ?I do suspect that you are suffering from a viral illness. ? ?Use the Atrovent nasal spray, 2 squirts up each nostril every 6 hours as needed, for runny nose and nasal congestion. ? ?Use the albuterol inhaler, 2 puffs every 4-6 hours with a spacer, as needed for shortness of breath. ? ?Use the Tessalon Perles every 8 hours during the day as needed for cough. ? ?If develop any new or worsening symptoms please return for reevaluation or see your primary care provider. ?

## 2021-11-03 DIAGNOSIS — E119 Type 2 diabetes mellitus without complications: Secondary | ICD-10-CM | POA: Diagnosis not present

## 2021-11-03 DIAGNOSIS — I10 Essential (primary) hypertension: Secondary | ICD-10-CM | POA: Diagnosis not present

## 2021-11-03 DIAGNOSIS — Z20822 Contact with and (suspected) exposure to covid-19: Secondary | ICD-10-CM | POA: Diagnosis not present

## 2021-11-03 DIAGNOSIS — R519 Headache, unspecified: Secondary | ICD-10-CM | POA: Diagnosis not present

## 2021-11-03 DIAGNOSIS — R6883 Chills (without fever): Secondary | ICD-10-CM | POA: Diagnosis not present

## 2021-11-03 DIAGNOSIS — Z79891 Long term (current) use of opiate analgesic: Secondary | ICD-10-CM | POA: Diagnosis not present

## 2021-11-03 DIAGNOSIS — H73011 Bullous myringitis, right ear: Secondary | ICD-10-CM | POA: Diagnosis not present

## 2021-11-03 DIAGNOSIS — I2510 Atherosclerotic heart disease of native coronary artery without angina pectoris: Secondary | ICD-10-CM | POA: Diagnosis not present

## 2021-11-03 DIAGNOSIS — Z792 Long term (current) use of antibiotics: Secondary | ICD-10-CM | POA: Diagnosis not present

## 2021-11-03 DIAGNOSIS — E7800 Pure hypercholesterolemia, unspecified: Secondary | ICD-10-CM | POA: Diagnosis not present

## 2021-11-03 DIAGNOSIS — E785 Hyperlipidemia, unspecified: Secondary | ICD-10-CM | POA: Diagnosis not present

## 2021-11-03 DIAGNOSIS — B349 Viral infection, unspecified: Secondary | ICD-10-CM | POA: Diagnosis not present

## 2021-11-03 DIAGNOSIS — F17210 Nicotine dependence, cigarettes, uncomplicated: Secondary | ICD-10-CM | POA: Diagnosis not present

## 2021-11-03 DIAGNOSIS — H9201 Otalgia, right ear: Secondary | ICD-10-CM | POA: Diagnosis not present

## 2021-11-03 DIAGNOSIS — R059 Cough, unspecified: Secondary | ICD-10-CM | POA: Diagnosis not present

## 2021-11-04 DIAGNOSIS — H73011 Bullous myringitis, right ear: Secondary | ICD-10-CM | POA: Diagnosis not present

## 2021-11-05 ENCOUNTER — Ambulatory Visit (INDEPENDENT_AMBULATORY_CARE_PROVIDER_SITE_OTHER): Payer: BC Managed Care – PPO | Admitting: Family Medicine

## 2021-11-05 ENCOUNTER — Encounter: Payer: Self-pay | Admitting: Family Medicine

## 2021-11-05 ENCOUNTER — Other Ambulatory Visit: Payer: Self-pay | Admitting: Family Medicine

## 2021-11-05 ENCOUNTER — Other Ambulatory Visit: Payer: Self-pay

## 2021-11-05 VITALS — BP 168/86 | HR 90 | Ht 61.0 in | Wt 170.0 lb

## 2021-11-05 DIAGNOSIS — E782 Mixed hyperlipidemia: Secondary | ICD-10-CM

## 2021-11-05 DIAGNOSIS — E039 Hypothyroidism, unspecified: Secondary | ICD-10-CM

## 2021-11-05 DIAGNOSIS — E1169 Type 2 diabetes mellitus with other specified complication: Secondary | ICD-10-CM

## 2021-11-05 DIAGNOSIS — I10 Essential (primary) hypertension: Secondary | ICD-10-CM

## 2021-11-05 DIAGNOSIS — Z0000 Encounter for general adult medical examination without abnormal findings: Secondary | ICD-10-CM

## 2021-11-05 DIAGNOSIS — I2510 Atherosclerotic heart disease of native coronary artery without angina pectoris: Secondary | ICD-10-CM

## 2021-11-05 DIAGNOSIS — R7309 Other abnormal glucose: Secondary | ICD-10-CM

## 2021-11-05 MED ORDER — ROSUVASTATIN CALCIUM 40 MG PO TABS: 40.0000 mg | ORAL_TABLET | Freq: Every day | ORAL | 1 refills | Status: DC

## 2021-11-05 MED ORDER — METFORMIN HCL ER 750 MG PO TB24: ORAL_TABLET | ORAL | 1 refills | Status: DC

## 2021-11-05 MED ORDER — LOSARTAN POTASSIUM 50 MG PO TABS: 50.0000 mg | ORAL_TABLET | Freq: Every day | ORAL | 1 refills | Status: DC

## 2021-11-05 MED ORDER — LEVOTHYROXINE SODIUM 50 MCG PO TABS: 50.0000 ug | ORAL_TABLET | Freq: Every day | ORAL | 1 refills | Status: DC

## 2021-11-05 MED ORDER — METOPROLOL SUCCINATE ER 50 MG PO TB24: 50.0000 mg | ORAL_TABLET | Freq: Every day | ORAL | 1 refills | Status: DC

## 2021-11-05 NOTE — Assessment & Plan Note (Signed)
Chronic problem CAD, s/p CABG ?Followed by [facility] ?Continue med management including Statin ?

## 2021-11-05 NOTE — Patient Instructions (Addendum)
Thank you for coming to the office today. ? ?All medicines refilled ? ?Metformin now is XR daily dose, no longer twice daily. ? ?DUE for FASTING BLOOD WORK (no food or drink after midnight before the lab appointment, only water or coffee without cream/sugar on the morning of) ? ?SCHEDULE "Lab Only" visit in the morning at the clinic for lab draw in 3 MONTHS  ? ?- Make sure Lab Only appointment is at about 1 week before your next appointment, so that results will be available ? ?For Lab Results, once available within 2-3 days of blood draw, you can can log in to MyChart online to view your results and a brief explanation. Also, we can discuss results at next follow-up visit. ? ? ?Please schedule a Follow-up Appointment to: Return in about 3 months (around 02/05/2022) for 3 month fasting lab only then 1 week later Annual Physical. ? ?If you have any other questions or concerns, please feel free to call the office or send a message through MyChart. You may also schedule an earlier appointment if necessary. ? ?Additionally, you may be receiving a survey about your experience at our office within a few days to 1 week by e-mail or mail. We value your feedback. ? ?Saralyn Pilar, DO ?[facility], [place] ?

## 2021-11-05 NOTE — Progress Notes (Signed)
? ?Subjective:  ? ? Patient ID: Morgan Wright, female    DOB: 1965-10-27, 56 y.o.   MRN: [id number] ? ?Morgan Wright is a 56 y.o. female presenting on 11/05/2021 for Diabetes and Hypertension ? ?Previously seen 2020, but then lost to follow up, without job and lack of insurance. ?Now has insurance coverage again. ? ?HPI ? ?Recent visit to ED over weekend ?Treated for ear infection ?On Pain medication ?Feels like the noise of a "fan" on ear ? ?Type 2 Diabetes ?Previous diagnosis 2020. Initially on metformin, has remained off medicines since ran out of insurance. ?CBGs: Not checking ?Meds: Metformin 500mg  x 3 per day - now OFF medicine ?Currently on ARB ?Lifestyle: ?- Diet (not following DM diet - she has tried to improve but not always following a specific diet,  ?- Exercise (limited currently) ?Denies hypoglycemia, polyuria, visual changes, numbness or tingling. ?  ?CHRONIC HTN:  ?No new concerns. ?Current Meds - Losartan 50mg  daily, Metoprolol XL succinate 50mg  daily ?Out of medicines. ?Denies CP, dyspnea, HA, edema, dizziness / lightheadedness ?  ?Hypothyroidism ?Prior history of low thyroid hormone ?Previously on levothyroxine daily, needs re order ?  ?HYPERLIPIDEMIA: ?Known CAD and history of HLD, previously improved on statin. Due for repeat order ?  ? ? ?Depression screen [facility] 2/9 11/05/2021 03/14/2019 12/24/2018  ?Decreased Interest 0 0 0  ?Down, Depressed, Hopeless 0 0 0  ?PHQ - 2 Score 0 0 0  ?Altered sleeping 2 - -  ?Tired, decreased energy 2 - -  ?Change in appetite 3 - -  ?Feeling bad or failure about yourself  0 - -  ?Trouble concentrating 0 - -  ?Moving slowly or fidgety/restless 0 - -  ?Suicidal thoughts 0 - -  ?PHQ-9 Score 7 - -  ?Difficult doing work/chores Not difficult at all - -  ? ? ?Social History  ? ?Tobacco Use  ? Smoking status: Every Day  ?  Packs/day: 0.75  ?  Years: 40.00  ?  Pack years: 30.00  ?  Types: Cigarettes  ? Smokeless tobacco: Never  ?Vaping Use  ? Vaping Use: Never used   ?Substance Use Topics  ? Alcohol use: No  ? Drug use: Never  ? ? ?Review of Systems ?Per HPI unless specifically indicated above ? ?   ?Objective:  ?  ?BP (!) 168/86 (BP Location: Left Arm, Cuff Size: Normal)   Pulse 90   Ht 5\' 1"  (1.549 m)   Wt 170 lb (77.1 kg)   SpO2 98%   BMI 32.12 kg/m?   ?Wt Readings from Last 3 Encounters:  ?11/05/21 170 lb (77.1 kg)  ?10/29/21 160 lb (72.6 kg)  ?08/10/19 180 lb (81.6 kg)  ?  ?Physical Exam ?Vitals and nursing note reviewed.  ?Constitutional:   ?   General: She is not in acute distress. ?   Appearance: Normal appearance. She is well-developed. She is not diaphoretic.  ?   Comments: Well-appearing, comfortable, cooperative  ?HENT:  ?   Head: Normocephalic and atraumatic.  ?Eyes:  ?   General:     ?   Right eye: No discharge.     ?   Left eye: No discharge.  ?   Conjunctiva/sclera: Conjunctivae normal.  ?Cardiovascular:  ?   Rate and Rhythm: Normal rate.  ?Pulmonary:  ?   Effort: Pulmonary effort is normal.  ?Skin: ?   General: Skin is warm and dry.  ?   Findings: No erythema or rash.  ?Neurological:  ?  Mental Status: She is alert and oriented to person, place, and time.  ?Psychiatric:     ?   Mood and Affect: Mood normal.     ?   Behavior: Behavior normal.     ?   Thought Content: Thought content normal.  ?   Comments: Well groomed, good eye contact, normal speech and thoughts  ? ? ? ? ?Results for orders placed or performed during the hospital encounter of 10/29/21  ?Resp Panel by RT-PCR (Flu A&B, Covid) Nasopharyngeal Swab  ? Specimen: Nasopharyngeal Swab; Nasopharyngeal(NP) swabs in vial transport medium  ?Result Value Ref Range  ? SARS Coronavirus 2 by RT PCR NEGATIVE NEGATIVE  ? Influenza A by PCR NEGATIVE NEGATIVE  ? Influenza B by PCR NEGATIVE NEGATIVE  ?Urinalysis, Routine w reflex microscopic Urine, Clean Catch  ?Result Value Ref Range  ? Color, Urine YELLOW YELLOW  ? APPearance CLEAR CLEAR  ? Specific Gravity, Urine 1.015 1.005 - 1.030  ? pH 7.0 5.0 - 8.0  ?  Glucose, UA 500 (A) NEGATIVE mg/dL  ? Hgb urine dipstick NEGATIVE NEGATIVE  ? Bilirubin Urine NEGATIVE NEGATIVE  ? Ketones, ur NEGATIVE NEGATIVE mg/dL  ? Protein, ur 30 (A) NEGATIVE mg/dL  ? Nitrite NEGATIVE NEGATIVE  ? Leukocytes,Ua NEGATIVE NEGATIVE  ?Urinalysis, Microscopic (reflex)  ?Result Value Ref Range  ? RBC / HPF 0-5 0 - 5 RBC/hpf  ? WBC, UA 0-5 0 - 5 WBC/hpf  ? Bacteria, UA RARE (A) NONE SEEN  ? Squamous Epithelial / LPF 0-5 0 - 5  ?Glucose, capillary  ?Result Value Ref Range  ? Glucose-Capillary 168 (H) 70 - 99 mg/dL  ?CBC with Differential  ?Result Value Ref Range  ? WBC 9.1 4.0 - 10.5 K/uL  ? RBC 5.09 3.87 - 5.11 MIL/uL  ? Hemoglobin 15.1 (H) 12.0 - 15.0 g/dL  ? HCT 43.6 36.0 - 46.0 %  ? MCV 85.7 80.0 - 100.0 fL  ? MCH 29.7 26.0 - 34.0 pg  ? MCHC 34.6 30.0 - 36.0 g/dL  ? RDW 13.3 11.5 - 15.5 %  ? Platelets 225 150 - 400 K/uL  ? nRBC 0.0 0.0 - 0.2 %  ? Neutrophils Relative % 75 %  ? Neutro Abs 6.8 1.7 - 7.7 K/uL  ? Lymphocytes Relative 16 %  ? Lymphs Abs 1.5 0.7 - 4.0 K/uL  ? Monocytes Relative 7 %  ? Monocytes Absolute 0.7 0.1 - 1.0 K/uL  ? Eosinophils Relative 1 %  ? Eosinophils Absolute 0.1 0.0 - 0.5 K/uL  ? Basophils Relative 1 %  ? Basophils Absolute 0.1 0.0 - 0.1 K/uL  ? Immature Granulocytes 0 %  ? Abs Immature Granulocytes 0.04 0.00 - 0.07 K/uL  ?Comprehensive metabolic panel  ?Result Value Ref Range  ? Sodium 130 (L) 135 - 145 mmol/L  ? Potassium 4.5 3.5 - 5.1 mmol/L  ? Chloride 96 (L) 98 - 111 mmol/L  ? CO2 24 22 - 32 mmol/L  ? Glucose, Bld 151 (H) 70 - 99 mg/dL  ? BUN 12 6 - 20 mg/dL  ? Creatinine, Ser 0.93 0.44 - 1.00 mg/dL  ? Calcium 8.9 8.9 - 10.3 mg/dL  ? Total Protein 7.6 6.5 - 8.1 g/dL  ? Albumin 3.9 3.5 - 5.0 g/dL  ? AST 19 15 - 41 U/L  ? ALT 12 0 - 44 U/L  ? Alkaline Phosphatase 56 38 - 126 U/L  ? Total Bilirubin 0.4 0.3 - 1.2 mg/dL  ? GFR, Estimated >60 >60 mL/min  ?  Anion gap 10 5 - 15  ? ?   ?Assessment & Plan:  ? ?Problem List Items Addressed This Visit   ? ? Type 2 diabetes  mellitus with other specified complication (HCC) - Primary  ?  Overdue for repeat A1c and management of DM ?Off meds for past 2 years due to lack of insurance ?With  Hyperglycemia ?Complications - hyperlipidemia obesity, hypothyroidism, CAD - increases risk of future cardiovascular complications  ? ?Plan:  ?1. Switch to Metformin XR 1500mg  daily in AM, start titrate at 750 dose ?- Defer new DM agents for now, will keep trying to maximize lifestyle and current med, future reconsider GLP1 SGLT2 ?2. Encourage improved lifestyle - low carb, low sugar diet, reduce portion size, continue improving regular exercise - quit sodas and reduce sugar in tea ?3. Check CBG , bring log to next visit for review ?4. Continue ARB, Statin ?Due for DM updates foot, eye, urine  ?  ?  ? Relevant Medications  ? losartan (COZAAR) 50 MG tablet  ? metFORMIN (GLUCOPHAGE-XR) 750 MG 24 hr tablet  ? rosuvastatin (CRESTOR) 40 MG tablet  ? Essential hypertension  ?  Elevated BP off medication ?Complicated by CAD ?  ? ?Plan:  ?1. Continue current regimen - Losartan 50mg  daily, Metoprolol XL 50mg  daily - re order today ?2. Encourage improved lifestyle - low sodium diet, regular exercise ?3. Keep monitor BP outside office, bring readings to next visit, if persistently >140/90 or new symptoms notify office sooner - her daughter is in training for nurse ?  ?  ? Relevant Medications  ? losartan (COZAAR) 50 MG tablet  ? metoprolol succinate (TOPROL-XL) 50 MG 24 hr tablet  ? rosuvastatin (CRESTOR) 40 MG tablet  ? Coronary artery disease involving native coronary artery of native heart without angina pectoris  ?  Chronic problem CAD, s/p CABG ?Followed by [facility] ?Continue med management including Statin ?  ?  ? Relevant Medications  ? losartan (COZAAR) 50 MG tablet  ? metoprolol succinate (TOPROL-XL) 50 MG 24 hr tablet  ? rosuvastatin (CRESTOR) 40 MG tablet  ? Acquired hypothyroidism  ?  Overdue for lab testing for thyroid, currently off  med ? ?Plan ?Restart Levothyroxine daily repeat labs in 3 months ?  ?  ? Relevant Medications  ? levothyroxine (SYNTHROID) 50 MCG tablet  ? metoprolol succinate (TOPROL-XL) 50 MG 24 hr tablet  ? ?Other Visit Diagnoses   ?

## 2021-11-05 NOTE — Assessment & Plan Note (Signed)
Overdue for repeat A1c and management of DM ?Off meds for past 2 years due to lack of insurance ?With  Hyperglycemia ?Complications - hyperlipidemia obesity, hypothyroidism, CAD - increases risk of future cardiovascular complications  ? ?Plan:  ?1. Switch to Metformin XR 1500mg  daily in AM, start titrate at 750 dose ?- Defer new DM agents for now, will keep trying to maximize lifestyle and current med, future reconsider GLP1 SGLT2 ?2. Encourage improved lifestyle - low carb, low sugar diet, reduce portion size, continue improving regular exercise - quit sodas and reduce sugar in tea ?3. Check CBG , bring log to next visit for review ?4. Continue ARB, Statin ?Due for DM updates foot, eye, urine  ?

## 2021-11-05 NOTE — Assessment & Plan Note (Signed)
Elevated BP off medication ?Complicated by CAD ?  ? ?Plan:  ?1. Continue current regimen - Losartan 50mg  daily, Metoprolol XL 50mg  daily - re order today ?2. Encourage improved lifestyle - low sodium diet, regular exercise ?3. Keep monitor BP outside office, bring readings to next visit, if persistently >140/90 or new symptoms notify office sooner - her daughter is in training for nurse ?

## 2021-11-05 NOTE — Assessment & Plan Note (Signed)
Overdue for lab testing for thyroid, currently off med ? ?Plan ?Restart Levothyroxine daily repeat labs in 3 months ?

## 2022-02-04 ENCOUNTER — Other Ambulatory Visit: Payer: Self-pay

## 2022-02-04 DIAGNOSIS — I10 Essential (primary) hypertension: Secondary | ICD-10-CM

## 2022-02-04 DIAGNOSIS — E782 Mixed hyperlipidemia: Secondary | ICD-10-CM

## 2022-02-04 DIAGNOSIS — E1169 Type 2 diabetes mellitus with other specified complication: Secondary | ICD-10-CM

## 2022-02-04 DIAGNOSIS — Z0000 Encounter for general adult medical examination without abnormal findings: Secondary | ICD-10-CM

## 2022-02-04 DIAGNOSIS — I2510 Atherosclerotic heart disease of native coronary artery without angina pectoris: Secondary | ICD-10-CM

## 2022-02-04 DIAGNOSIS — E039 Hypothyroidism, unspecified: Secondary | ICD-10-CM

## 2022-02-05 ENCOUNTER — Other Ambulatory Visit: Payer: Self-pay

## 2022-02-05 DIAGNOSIS — E782 Mixed hyperlipidemia: Secondary | ICD-10-CM | POA: Diagnosis not present

## 2022-02-05 DIAGNOSIS — I2510 Atherosclerotic heart disease of native coronary artery without angina pectoris: Secondary | ICD-10-CM | POA: Diagnosis not present

## 2022-02-05 DIAGNOSIS — E1169 Type 2 diabetes mellitus with other specified complication: Secondary | ICD-10-CM | POA: Diagnosis not present

## 2022-02-05 DIAGNOSIS — E039 Hypothyroidism, unspecified: Secondary | ICD-10-CM | POA: Diagnosis not present

## 2022-02-06 LAB — CBC WITH DIFFERENTIAL/PLATELET
Absolute Monocytes: 883 cells/uL (ref 200–950)
Basophils Absolute: 110 cells/uL (ref 0–200)
Basophils Relative: 0.8 %
Eosinophils Absolute: 179 cells/uL (ref 15–500)
Eosinophils Relative: 1.3 %
HCT: 44.3 % (ref 35.0–45.0)
Hemoglobin: 15 g/dL (ref 11.7–15.5)
Lymphs Abs: 3726 cells/uL (ref 850–3900)
MCH: 30 pg (ref 27.0–33.0)
MCHC: 33.9 g/dL (ref 32.0–36.0)
MCV: 88.6 fL (ref 80.0–100.0)
MPV: 10.3 fL (ref 7.5–12.5)
Monocytes Relative: 6.4 %
Neutro Abs: 8901 cells/uL — ABNORMAL HIGH (ref 1500–7800)
Neutrophils Relative %: 64.5 %
Platelets: 266 10*3/uL (ref 140–400)
RBC: 5 10*6/uL (ref 3.80–5.10)
RDW: 14 % (ref 11.0–15.0)
Total Lymphocyte: 27 %
WBC: 13.8 10*3/uL — ABNORMAL HIGH (ref 3.8–10.8)

## 2022-02-06 LAB — COMPLETE METABOLIC PANEL WITH GFR
AG Ratio: 1.6 (calc) (ref 1.0–2.5)
ALT: 14 U/L (ref 6–29)
AST: 12 U/L (ref 10–35)
Albumin: 4.1 g/dL (ref 3.6–5.1)
Alkaline phosphatase (APISO): 64 U/L (ref 37–153)
BUN/Creatinine Ratio: 15 (calc) (ref 6–22)
BUN: 16 mg/dL (ref 7–25)
CO2: 28 mmol/L (ref 20–32)
Calcium: 9.5 mg/dL (ref 8.6–10.4)
Chloride: 105 mmol/L (ref 98–110)
Creat: 1.07 mg/dL — ABNORMAL HIGH (ref 0.50–1.03)
Globulin: 2.5 g/dL (calc) (ref 1.9–3.7)
Glucose, Bld: 125 mg/dL — ABNORMAL HIGH (ref 65–99)
Potassium: 5.2 mmol/L (ref 3.5–5.3)
Sodium: 139 mmol/L (ref 135–146)
Total Bilirubin: 0.4 mg/dL (ref 0.2–1.2)
Total Protein: 6.6 g/dL (ref 6.1–8.1)
eGFR: 61 mL/min/{1.73_m2} (ref >=60)

## 2022-02-06 LAB — HEMOGLOBIN A1C
Hgb A1c MFr Bld: 6.6 % of total Hgb — ABNORMAL HIGH (ref <5.7)
Mean Plasma Glucose: 143 mg/dL
eAG (mmol/L): 7.9 mmol/L

## 2022-02-06 LAB — LIPID PANEL
Cholesterol: 146 mg/dL (ref <200)
HDL: 34 mg/dL — ABNORMAL LOW (ref >=50)
LDL Cholesterol (Calc): 84 mg/dL (calc)
Non-HDL Cholesterol (Calc): 112 mg/dL (calc) (ref <130)
Total CHOL/HDL Ratio: 4.3 (calc) (ref <5.0)
Triglycerides: 185 mg/dL — ABNORMAL HIGH (ref <150)

## 2022-02-06 LAB — T4, FREE: Free T4: 1.1 ng/dL (ref 0.8–1.8)

## 2022-02-06 LAB — TSH: TSH: 6.33 mIU/L — ABNORMAL HIGH

## 2022-02-12 ENCOUNTER — Encounter: Payer: Self-pay | Admitting: Family Medicine

## 2022-03-26 ENCOUNTER — Encounter: Payer: Self-pay | Admitting: Family Medicine

## 2022-03-26 ENCOUNTER — Ambulatory Visit (INDEPENDENT_AMBULATORY_CARE_PROVIDER_SITE_OTHER): Payer: Self-pay | Admitting: Family Medicine

## 2022-03-26 VITALS — BP 164/84 | HR 79 | Ht 61.0 in | Wt 174.2 lb

## 2022-03-26 DIAGNOSIS — R011 Cardiac murmur, unspecified: Secondary | ICD-10-CM

## 2022-03-26 DIAGNOSIS — Z1211 Encounter for screening for malignant neoplasm of colon: Secondary | ICD-10-CM

## 2022-03-26 DIAGNOSIS — R0989 Other specified symptoms and signs involving the circulatory and respiratory systems: Secondary | ICD-10-CM

## 2022-03-26 DIAGNOSIS — I2510 Atherosclerotic heart disease of native coronary artery without angina pectoris: Secondary | ICD-10-CM

## 2022-03-26 DIAGNOSIS — E66811 Obesity, class 1: Secondary | ICD-10-CM

## 2022-03-26 DIAGNOSIS — Z1231 Encounter for screening mammogram for malignant neoplasm of breast: Secondary | ICD-10-CM

## 2022-03-26 DIAGNOSIS — Z124 Encounter for screening for malignant neoplasm of cervix: Secondary | ICD-10-CM

## 2022-03-26 DIAGNOSIS — Z0000 Encounter for general adult medical examination without abnormal findings: Secondary | ICD-10-CM

## 2022-03-26 DIAGNOSIS — I10 Essential (primary) hypertension: Secondary | ICD-10-CM

## 2022-03-26 DIAGNOSIS — E782 Mixed hyperlipidemia: Secondary | ICD-10-CM

## 2022-03-26 DIAGNOSIS — Z951 Presence of aortocoronary bypass graft: Secondary | ICD-10-CM

## 2022-03-26 DIAGNOSIS — E1169 Type 2 diabetes mellitus with other specified complication: Secondary | ICD-10-CM | POA: Diagnosis not present

## 2022-03-26 DIAGNOSIS — E669 Obesity, unspecified: Secondary | ICD-10-CM

## 2022-03-26 DIAGNOSIS — E039 Hypothyroidism, unspecified: Secondary | ICD-10-CM

## 2022-03-26 MED ORDER — ROSUVASTATIN CALCIUM 40 MG PO TABS: 40.0000 mg | ORAL_TABLET | Freq: Every day | ORAL | 3 refills | Status: DC

## 2022-03-26 MED ORDER — LEVOTHYROXINE SODIUM 50 MCG PO TABS: 50.0000 ug | ORAL_TABLET | Freq: Every day | ORAL | 3 refills | Status: DC

## 2022-03-26 MED ORDER — OLMESARTAN MEDOXOMIL 40 MG PO TABS: 40.0000 mg | ORAL_TABLET | Freq: Every day | ORAL | 3 refills | Status: DC

## 2022-03-26 MED ORDER — METFORMIN HCL ER 750 MG PO TB24: 1500.0000 mg | ORAL_TABLET | Freq: Every day | ORAL | 3 refills | Status: DC

## 2022-03-26 MED ORDER — METOPROLOL SUCCINATE ER 50 MG PO TB24: 50.0000 mg | ORAL_TABLET | Freq: Every day | ORAL | 3 refills | Status: DC

## 2022-03-26 NOTE — Assessment & Plan Note (Signed)
Elevated BP still, repeat improved Complicated by CAD    Plan:  1. Double dose Losartan from 50 to 100mg  daily, then switch to Olmesartan 40mg  daily new rx ordered. - COntinue Metoprolol XL 50mg  daily 2. Encourage improved lifestyle - low sodium diet, regular exercise 3. Keep monitor BP outside office, bring readings to next visit, if persistently >140/90 or new symptoms notify office sooner - her daughter is in training for nurse

## 2022-03-26 NOTE — Assessment & Plan Note (Signed)
A1c 6.6 improved significantly Complications - hyperlipidemia obesity, hypothyroidism, CAD - increases risk of future cardiovascular complications   Plan:  1. Continue to Metformin XR 1500mg  daily in PM - Defer new DM agents for now, will keep trying to maximize lifestyle and current med, future reconsider GLP1 SGLT2 2. Encourage improved lifestyle - low carb, low sugar diet, reduce portion size, continue improving regular exercise - quit sodas and reduce sugar in tea 3. Check CBG , bring log to next visit for review 4. Continue ARB, Statin DM Foot, Urine Micro, Recommend DM Eye

## 2022-03-26 NOTE — Assessment & Plan Note (Signed)
Controlled TSH T4 On Levothyroxine 50 mcg daily

## 2022-03-26 NOTE — Patient Instructions (Addendum)
Thank you for coming to the office today.  Recent Labs    02/05/22 0807  HGBA1C 6.6*   Excellent result on blood sugar.  Refilled all medicines for future fills. Up to 1 year.  BP still elevated, we can repeat  I recommend checking your BP at home, goal < 140 / 90  Thyroid is normal, keep on current dose Levothyroxine 18mg daily  Finish Losartan 534mx 2 = 10073maily.  When finished, start the new BP med. Olmesartan (Benicar) 74m19mily -----  Diabetic Eye Exam our fax # [phone] Regional Medical Centerddress: [address] [place] [phone]: [phone]: [email or web address] [place] [phone]: [phone]  [facility] Infirmarydress: [address] [phone]: [phone] Forest Hospital[address] [place]ne: [phone] Eye And Ear Surgeryress: [address] [phone]: ([phone]-------------------------  They will call you  [facility] - Colliervilleddress: [address] [address] [phone]: 8AM-5PM Phone: [phone]------------  Ordered the Cologuard (home kit) test for colon cancer screening. Stay tuned for further updates.  It will be shipped to you directly. If not received in 2-4 weeks, call us [employer] company.   If you send it back and no results are received in 2-4 weeks, call us [employer] company as well!   Colon Cancer Screening: - For all adults age 42+ 76+tine colon cancer screening is highly recommended.     - Recent guidelines from [place] starting age of 45 -61arly detection of colon cancer is important, because often there are no warning signs or symptoms, also if found early usually it can be cured. Late stage is hard to treat.   - If Cologuard is NEGATIVE, then it is good  for 3 years before next due - If Cologuard is POSITIVE, then it is strongly advised to get a Colonoscopy, which allows the GI doctor to locate the source of the cancer or polyp (even very early stage) and treat it by removing it. ------------------------- Follow instructions to collect sample, you may call the company for any help or questions, 24/7 telephone support at [phone] abnormal, then the Colonoscopy should still be covered.  -------------------------  For Mammogram screening for breast cancer   Call the [facility] anytime to schedule your own appointment now that order has been placed.  [facility]vVandercook LakeAlamNazareth Hospital[address]itT[address] [place] [phone]: [phone]--------------  Shingles vaccine Shingrix, 2 doses 2-6 months apart. Maybe check cost with insurance, and can get at Pharmacy or here for Nurse Visit. Can cause flu like symptoms.  Please schedule a Follow-up Appointment to: Return in about 6 months (around 09/26/2022) for 6 month follow-up DM A1c, HTN.  If you have any other questions or concerns, please feel free to call the office or send a message through MyChFallbrooku may also schedule an earlier appointment if necessary.  Additionally, you may be receiving a survey about your experience at our office within a few days to 1 week by e-mail or mail. We value your feedback.  Morgan Wright

## 2022-03-26 NOTE — Assessment & Plan Note (Signed)
Chronic problem CAD, s/p CABG Previously followed by [facility] Dr Lady Gary, but they have retired. Continue med management including Statin

## 2022-03-26 NOTE — Progress Notes (Signed)
Subjective:    Patient ID: Morgan Wright, female    DOB: 13-Jun-1966, 56 y.o.   MRN: [id number]  Morgan Wright is a 56 y.o. female presenting on 03/26/2022 for Annual Exam   HPI  Here for Annual Physical   Type 2 Diabetes Diagnosed 2020, has resumed medications CBGs:Controlled  Meds: Metformin XR 750 x 2 = 1500 daily with supper Currently on ARB Lifestyle: - Diet (not following DM diet - she has tried to improve but not always following a specific diet,  - Exercise (limited currently) Denies hypoglycemia, polyuria, visual changes, numbness or tingling.   CHRONIC HTN:  Elevated BP in office, not checking regularly at home. Current Meds - Losartan 64m daily, Metoprolol XL succinate 577mdaily Out of medicines. Denies CP, dyspnea, HA, edema, dizziness / lightheadedness   Hypothyroidism Prior history of low thyroid hormone Previously on levothyroxine 5067mdaily, needs re order   HYPERLIPIDEMIA: Known CAD and history of HLD Controlled on Rosuvastatin 19m8mily  CAD s/p CABG x 3 2010 Today identified new systolic murmur also with carotid bruits  Health Maintenance:  See A&P for orders.  Past Surgical History:  Procedure Laterality Date   CARDIAC SURGERY     CESAREAN SECTION     x 3 - 1984-1994   MICROLARYNGOSCOPY W/VOCAL CORD INJECTION Bilateral 01/28/2019   Procedure: MICROLARYNGOSCOPY WITH VOCAL CORD EXCISION;  Surgeon: McQuBeverly Gust;  [place] [facility];  Laterality: Bilateral;   TONSILLECTOMY          03/26/2022    1:20 PM 11/05/2021    3:38 PM 03/14/2019    3:16 PM  Depression screen PHQ 2/9  Decreased Interest 0 0 0  Down, Depressed, Hopeless 0 0 0  PHQ - 2 Score 0 0 0  Altered sleeping 0 2   Tired, decreased energy 0 2   Change in appetite 0 3   Feeling bad or failure about yourself  0 0   Trouble concentrating 0 0   Moving slowly or fidgety/restless  0   Suicidal thoughts 0 0   PHQ-9 Score 0 7   Difficult doing  work/chores Not difficult at all Not difficult at all     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   Past Surgical History:  Procedure Laterality Date   CARDIAC SURGERY     CESAREAN SECTION     x 3 - 1984-1994   MICROLARYNGOSCOPY W/VOCAL CORD INJECTION Bilateral 01/28/2019   Procedure: MICROLARYNGOSCOPY WITH VOCAL CORD EXCISION;  Surgeon: McQuBeverly Gust;  [place] [facility];  Laterality: Bilateral;   TONSILLECTOMY     Social History   Socioeconomic History   Marital status: Single    Spouse name: Not on file   Number of children: Not on file   Years of education: High School   Highest education level: High school graduate  Occupational History   Occupation: Shipping/Receiving    [place] [employer] [place]   Smoking status: Every Day    Packs/day: 0.75    Years: 40.00    Total pack years: 30.00  Types: Cigarettes   Smokeless tobacco: Never  Vaping Use   Vaping Use: Never used  Substance and Sexual Activity   Alcohol use: No   Drug use: Never   Sexual activity: Not on file  Other Topics Concern   Not on file  Social History Narrative   Not on file   Social Determinants of Health   Financial Resource Strain: Not on file  Food Insecurity: Not on file  Transportation Needs: Not on file  Physical Activity: Not on file  Stress: Not on file  Social Connections: Not on file  Intimate Partner Violence: Not on file   Family History  Problem Relation Age of Onset   Heart disease Mother    Anuerysm Father 87   Lymphoma Sister        [place]   Current Outpatient Medications on File Prior to Visit   Medication Sig   albuterol (VENTOLIN HFA) 108 (90 Base) MCG/ACT inhaler Inhale 2 puffs into the lungs every 4 (four) hours as needed.   Spacer/Aero-Holding Chambers (AEROCHAMBER MV) inhaler Use as instructed   No current facility-administered medications on file prior to visit.    Review of Systems  Constitutional:  Negative for activity change, appetite change, chills, diaphoresis, fatigue and fever.  HENT:  Negative for congestion and hearing loss.   Eyes:  Negative for visual disturbance.  Respiratory:  Negative for cough, chest tightness, shortness of breath and wheezing.   Cardiovascular:  Negative for chest pain, palpitations and leg swelling.  Gastrointestinal:  Negative for abdominal pain, constipation, diarrhea, nausea and vomiting.  Genitourinary:  Negative for dysuria, frequency and hematuria.  Musculoskeletal:  Negative for arthralgias and neck pain.  Skin:  Negative for rash.  Neurological:  Negative for dizziness, weakness, light-headedness, numbness and headaches.  Hematological:  Negative for adenopathy.  Psychiatric/Behavioral:  Negative for behavioral problems, dysphoric mood and sleep disturbance.    Per HPI unless specifically indicated above      Objective:    BP (!) 188/83   Pulse 79   Ht _0  (1.549 m)   Wt 174 lb 3.2 oz (79 kg)   SpO2 97%   BMI 32.91 kg/m   Wt Readings from Last 3 Encounters:  03/26/22 174 lb 3.2 oz (79 kg)  11/05/21 170 lb (77.1 kg)  10/29/21 160 lb (72.6 kg)    Physical Exam Vitals and nursing note reviewed.  Constitutional:      General: She is not in acute distress.    Appearance: She is well-developed. She is not diaphoretic.     Comments: Well-appearing, comfortable, cooperative  HENT:     Head: Normocephalic and atraumatic.  Eyes:     General:        Right eye: No discharge.        Left eye: No discharge.     Conjunctiva/sclera: Conjunctivae normal.     Pupils: Pupils are equal, round, and reactive to light.   Neck:     Thyroid: No thyromegaly.     Vascular: No carotid bruit.  Cardiovascular:     Rate and Rhythm: Normal rate and regular rhythm.     Pulses: Normal pulses.     Heart sounds: Normal heart sounds. No murmur heard. Pulmonary:     Effort: Pulmonary effort is normal. No respiratory distress.     Breath sounds: Normal breath sounds. No wheezing or rales.  Abdominal:     General: Bowel sounds are normal. There is no distension.     Palpations:  Abdomen is soft. There is no mass.     Tenderness: There is no abdominal tenderness.  Musculoskeletal:        General: No tenderness. Normal range of motion.     Cervical back: Normal range of motion and neck supple.     Right lower leg: No edema.     Left lower leg: No edema.     Comments: Upper / Lower Extremities: - Normal muscle tone, strength bilateral upper extremities 5/5, lower extremities 5/5  Lymphadenopathy:     Cervical: No cervical adenopathy.  Skin:    General: Skin is warm and dry.     Findings: No erythema or rash.  Neurological:     Mental Status: She is alert and oriented to person, place, and time.     Comments: Distal sensation intact to light touch all extremities  Psychiatric:        Mood and Affect: Mood normal.        Behavior: Behavior normal.        Thought Content: Thought content normal.     Comments: Well groomed, good eye contact, normal speech and thoughts     Diabetic Foot Exam - Simple   Simple Foot Form Diabetic Foot exam was performed with the following findings: Yes 03/26/2022  1:50 PM  Visual Inspection No deformities, no ulcerations, no other skin breakdown bilaterally: Yes Sensation Testing Intact to touch and monofilament testing bilaterally: Yes Pulse Check Posterior Tibialis and Dorsalis pulse intact bilaterally: Yes Comments       Results for orders placed or performed in visit on 02/04/22  T4, free  Result Value Ref Range   Free T4 1.1 0.8 - 1.8 ng/dL  TSH  Result Value Ref  Range   TSH 6.33 (H) mIU/L  Hemoglobin A1c  Result Value Ref Range   Hgb A1c MFr Bld 6.6 (H) <5.7 % of total Hgb   Mean Plasma Glucose 143 mg/dL   eAG (mmol/L) 7.9 mmol/L  Lipid panel  Result Value Ref Range   Cholesterol 146 <200 mg/dL   HDL 34 (L) > OR = 50 mg/dL   Triglycerides 185 (H) <150 mg/dL   LDL Cholesterol (Calc) 84 mg/dL (calc)   Total CHOL/HDL Ratio 4.3 <5.0 (calc)   Non-HDL Cholesterol (Calc) 112 <130 mg/dL (calc)  CBC with Differential/Platelet  Result Value Ref Range   WBC 13.8 (H) 3.8 - 10.8 Thousand/uL   RBC 5.00 3.80 - 5.10 Million/uL   Hemoglobin 15.0 11.7 - 15.5 g/dL   HCT 44.3 35.0 - 45.0 %   MCV 88.6 80.0 - 100.0 fL   MCH 30.0 27.0 - 33.0 pg   MCHC 33.9 32.0 - 36.0 g/dL   RDW 14.0 11.0 - 15.0 %   Platelets 266 140 - 400 Thousand/uL   MPV 10.3 7.5 - 12.5 fL   Neutro Abs 8,901 (H) 1,500 - 7,800 cells/uL   Lymphs Abs 3,726 850 - 3,900 cells/uL   Absolute Monocytes 883 200 - 950 cells/uL   Eosinophils Absolute 179 15 - 500 cells/uL   Basophils Absolute 110 0 - 200 cells/uL   Neutrophils Relative % 64.5 %   Total Lymphocyte 27.0 %   Monocytes Relative 6.4 %   Eosinophils Relative 1.3 %   Basophils Relative 0.8 %  COMPLETE METABOLIC PANEL WITH GFR  Result Value Ref Range   Glucose, Bld 125 (H) 65 - 99 mg/dL   BUN 16 7 - 25 mg/dL   Creat 1.07 (H) 0.50 - 1.03 mg/dL  eGFR 61 > OR = 60 mL/min/1.33m   BUN/Creatinine Ratio 15 6 - 22 (calc)   Sodium 139 135 - 146 mmol/L   Potassium 5.2 3.5 - 5.3 mmol/L   Chloride 105 98 - 110 mmol/L   CO2 28 20 - 32 mmol/L   Calcium 9.5 8.6 - 10.4 mg/dL   Total Protein 6.6 6.1 - 8.1 g/dL   Albumin 4.1 3.6 - 5.1 g/dL   Globulin 2.5 1.9 - 3.7 g/dL (calc)   AG Ratio 1.6 1.0 - 2.5 (calc)   Total Bilirubin 0.4 0.2 - 1.2 mg/dL   Alkaline phosphatase (APISO) 64 37 - 153 U/L   AST 12 10 - 35 U/L   ALT 14 6 - 29 U/L      Assessment & Plan:   Problem List Items Addressed This Visit     Acquired hypothyroidism     Controlled TSH T4 On Levothyroxine 50 mcg daily      Relevant Medications   levothyroxine (SYNTHROID) 50 MCG tablet   metoprolol succinate (TOPROL-XL) 50 MG 24 hr tablet   Coronary artery disease involving native coronary artery of native heart without angina pectoris    Chronic problem CAD, s/p CABG [facility] but they have retired. Continue med management including Statin      Relevant Medications   metoprolol succinate (TOPROL-XL) 50 MG 24 hr tablet   rosuvastatin (CRESTOR) 40 MG tablet   olmesartan (BENICAR) 40 MG tablet   Other Relevant Orders   Ambulatory referral to Cardiology   ECHOCARDIOGRAM COMPLETE   Essential hypertension    Elevated BP still, repeat improved Complicated by CAD    Plan:  1. Double dose Losartan from 50 to 102mdaily, then switch to Olmesartan 4048maily new rx ordered. - COntinue Metoprolol XL 59m58mily 2. Encourage improved lifestyle - low sodium diet, regular exercise 3. Keep monitor BP outside office, bring readings to next visit, if persistently >140/90 or new symptoms notify office sooner - her daughter is in training for nurse      Relevant Medications   metoprolol succinate (TOPROL-XL) 50 MG 24 hr tablet   rosuvastatin (CRESTOR) 40 MG tablet   olmesartan (BENICAR) 40 MG tablet   S/P CABG x 3   Relevant Orders   Ambulatory referral to Cardiology   Type 2 diabetes mellitus with other specified complication (HCC)    A1c B0W improved significantly Complications - hyperlipidemia obesity, hypothyroidism, CAD - increases risk of future cardiovascular complications   Plan:  1. Continue to Metformin XR 1500mg51mly in PM - Defer new DM agents for now, will keep trying to maximize lifestyle and current med, future reconsider GLP1 SGLT2 2. Encourage improved lifestyle - low carb, low sugar diet, reduce portion size, continue improving regular exercise - quit sodas and reduce sugar in tea 3. Check CBG , bring  log to next visit for review 4. Continue ARB, Statin DM Foot, Urine Micro, Recommend DM Eye      Relevant Medications   metFORMIN (GLUCOPHAGE-XR) 750 MG 24 hr tablet   rosuvastatin (CRESTOR) 40 MG tablet   olmesartan (BENICAR) 40 MG tablet   Other Relevant Orders   Urine Microalbumin w/creat. ratio   Other Visit Diagnoses     Annual physical exam    -  Primary   Mixed hyperlipidemia       Relevant Medications   metoprolol succinate (TOPROL-XL) 50 MG 24 hr tablet   rosuvastatin (CRESTOR) 40 MG tablet  olmesartan (BENICAR) 40 MG tablet   Cervical cancer screening       Relevant Orders   Ambulatory referral to Obstetrics / Gynecology   Colon cancer screening       Relevant Orders   Cologuard   Encounter for screening mammogram for malignant neoplasm of breast       Relevant Orders   MM 3D SCREEN BREAST BILATERAL   Systolic murmur       Relevant Orders   Ambulatory referral to Cardiology   ECHOCARDIOGRAM COMPLETE   Bilateral carotid bruits       Relevant Orders   ECHOCARDIOGRAM COMPLETE   Obesity (BMI 30.0-34.9)           Updated Health Maintenance information Reviewed recent lab results with patient Encouraged improvement to lifestyle with diet and exercise Goal of weight loss  Ordered Mammogram  Due for routine colon cancer screening. Never had colonoscopy (not interested), no family history colon cancer. - Discussion today about recommendations for either Colonoscopy or Cologuard screening, benefits and risks of screening, interested in Cologuard, understands that if positive then recommendation is for diagnostic colonoscopy to follow-up. - Ordered Cologuard today  Referral to [facility] for return to pap smear women's health  Refilled all medicines for future fills. Up to 1 year.    Orders Placed This Encounter  Procedures   MM 3D SCREEN BREAST BILATERAL    Standing Status:   Future    Standing Expiration Date:   03/27/2023    Order Specific Question:    Reason for Exam (SYMPTOM  OR DIAGNOSIS REQUIRED)    Answer:   Screening bilateral 3D Mammogram Tomo    Order Specific Question:   Preferred imaging location?    Answer:   [facility]   Cologuard   Urine Microalbumin w/creat. ratio   Ambulatory referral to Obstetrics / Gynecology    Referral Priority:   Routine    Referral Type:   Consultation    Referral Reason:   Specialty Services Required    Requested Specialty:   Obstetrics and Gynecology    Number of Visits Requested:   1   Ambulatory referral to Cardiology    Referral Priority:   Routine    Referral Type:   Consultation    Referral Reason:   Specialty Services Required    Requested Specialty:   Cardiology    Number of Visits Requested:   1   ECHOCARDIOGRAM COMPLETE    Standing Status:   Future    Standing Expiration Date:   03/27/2023    Order Specific Question:   Where should this test be performed    Answer:   [facility]    Order Specific Question:   Please indicate who you request to read the nuc med / echo results.    Answer:   [facility]    Order Specific Question:   Perflutren DEFINITY (image enhancing agent) should be administered unless hypersensitivity or allergy exist    Answer:   Administer Perflutren    Order Specific Question:   Is a special reader required? (athlete or structural heart)    Answer:   No    Order Specific Question:   Reason for exam-Echo    Answer:   Murmur R01.1     Meds ordered this encounter  Medications   levothyroxine (SYNTHROID) 50 MCG tablet    Sig: Take 1 tablet (50 mcg total) by mouth daily before breakfast.    Dispense:  90 tablet  Refill:  3    Please keep refills on file for future.   metFORMIN (GLUCOPHAGE-XR) 750 MG 24 hr tablet    Sig: Take 2 tablets (1,500 mg total) by mouth daily with supper.    Dispense:  180 tablet    Refill:  3    Keep on file for future refills   metoprolol succinate (TOPROL-XL) 50 MG 24 hr tablet    Sig: Take 1 tablet (50 mg  total) by mouth daily. Take with or immediately following a meal.    Dispense:  90 tablet    Refill:  3    Keep on file for future refills   rosuvastatin (CRESTOR) 40 MG tablet    Sig: Take 1 tablet (40 mg total) by mouth at bedtime.    Dispense:  90 tablet    Refill:  3    Keep on file future refills   olmesartan (BENICAR) 40 MG tablet    Sig: Take 1 tablet (40 mg total) by mouth daily.    Dispense:  90 tablet    Refill:  3    Discontinue Losartan 16m      Follow up plan: Return in about 6 months (around 09/26/2022) for 6 month follow-up DM A1c, HTN.  ANobie Putnam DO [facility] 03/26/2022, 1:23 PM

## 2022-03-27 LAB — MICROALBUMIN / CREATININE URINE RATIO
Creatinine, Urine: 78 mg/dL (ref 20–275)
Microalb Creat Ratio: 12 mcg/mg creat (ref <30)
Microalb, Ur: 0.9 mg/dL

## 2022-04-03 ENCOUNTER — Ambulatory Visit: Admission: RE | Admit: 2022-04-03 | Payer: BC Managed Care – PPO | Source: Ambulatory Visit

## 2022-04-16 ENCOUNTER — Encounter: Payer: Self-pay | Admitting: Obstetrics and Gynecology

## 2022-05-24 DIAGNOSIS — E119 Type 2 diabetes mellitus without complications: Secondary | ICD-10-CM | POA: Diagnosis not present

## 2022-05-24 DIAGNOSIS — I10 Essential (primary) hypertension: Secondary | ICD-10-CM | POA: Diagnosis not present

## 2022-05-24 DIAGNOSIS — E079 Disorder of thyroid, unspecified: Secondary | ICD-10-CM | POA: Diagnosis not present

## 2022-05-24 DIAGNOSIS — Z20822 Contact with and (suspected) exposure to covid-19: Secondary | ICD-10-CM | POA: Diagnosis not present

## 2022-05-24 DIAGNOSIS — R1031 Right lower quadrant pain: Secondary | ICD-10-CM | POA: Diagnosis not present

## 2022-05-24 DIAGNOSIS — I2510 Atherosclerotic heart disease of native coronary artery without angina pectoris: Secondary | ICD-10-CM | POA: Diagnosis not present

## 2022-05-24 DIAGNOSIS — E7800 Pure hypercholesterolemia, unspecified: Secondary | ICD-10-CM | POA: Diagnosis not present

## 2022-05-24 DIAGNOSIS — N261 Atrophy of kidney (terminal): Secondary | ICD-10-CM | POA: Diagnosis not present

## 2022-05-24 DIAGNOSIS — Z6832 Body mass index (BMI) 32.0-32.9, adult: Secondary | ICD-10-CM | POA: Diagnosis not present

## 2022-05-24 DIAGNOSIS — I700 Atherosclerosis of aorta: Secondary | ICD-10-CM | POA: Diagnosis not present

## 2022-05-24 DIAGNOSIS — F17210 Nicotine dependence, cigarettes, uncomplicated: Secondary | ICD-10-CM | POA: Diagnosis not present

## 2022-05-24 DIAGNOSIS — Z7984 Long term (current) use of oral hypoglycemic drugs: Secondary | ICD-10-CM | POA: Diagnosis not present

## 2022-05-24 DIAGNOSIS — Z79899 Other long term (current) drug therapy: Secondary | ICD-10-CM | POA: Diagnosis not present

## 2022-05-25 ENCOUNTER — Encounter (HOSPITAL_COMMUNITY): Payer: Self-pay

## 2022-05-25 ENCOUNTER — Inpatient Hospital Stay (HOSPITAL_COMMUNITY): Admit: 2022-05-25 | Payer: BC Managed Care – PPO | Admitting: Pulmonary Disease

## 2022-05-25 DIAGNOSIS — R1031 Right lower quadrant pain: Secondary | ICD-10-CM | POA: Diagnosis not present

## 2022-05-25 DIAGNOSIS — I700 Atherosclerosis of aorta: Secondary | ICD-10-CM | POA: Diagnosis not present

## 2022-05-26 DIAGNOSIS — E1151 Type 2 diabetes mellitus with diabetic peripheral angiopathy without gangrene: Secondary | ICD-10-CM | POA: Diagnosis not present

## 2022-05-26 DIAGNOSIS — R109 Unspecified abdominal pain: Secondary | ICD-10-CM | POA: Diagnosis not present

## 2022-05-26 DIAGNOSIS — E8809 Other disorders of plasma-protein metabolism, not elsewhere classified: Secondary | ICD-10-CM | POA: Diagnosis not present

## 2022-05-26 DIAGNOSIS — E782 Mixed hyperlipidemia: Secondary | ICD-10-CM | POA: Diagnosis not present

## 2022-05-26 DIAGNOSIS — Z789 Other specified health status: Secondary | ICD-10-CM | POA: Diagnosis not present

## 2022-05-26 DIAGNOSIS — I701 Atherosclerosis of renal artery: Secondary | ICD-10-CM | POA: Diagnosis not present

## 2022-05-26 DIAGNOSIS — I2510 Atherosclerotic heart disease of native coronary artery without angina pectoris: Secondary | ICD-10-CM | POA: Diagnosis not present

## 2022-05-26 DIAGNOSIS — F17200 Nicotine dependence, unspecified, uncomplicated: Secondary | ICD-10-CM | POA: Diagnosis not present

## 2022-05-26 DIAGNOSIS — I160 Hypertensive urgency: Secondary | ICD-10-CM | POA: Diagnosis not present

## 2022-05-26 DIAGNOSIS — I509 Heart failure, unspecified: Secondary | ICD-10-CM | POA: Diagnosis not present

## 2022-05-26 DIAGNOSIS — K5900 Constipation, unspecified: Secondary | ICD-10-CM | POA: Diagnosis not present

## 2022-05-26 DIAGNOSIS — N261 Atrophy of kidney (terminal): Secondary | ICD-10-CM | POA: Diagnosis not present

## 2022-05-26 DIAGNOSIS — I150 Renovascular hypertension: Secondary | ICD-10-CM | POA: Diagnosis not present

## 2022-05-26 DIAGNOSIS — I10 Essential (primary) hypertension: Secondary | ICD-10-CM | POA: Diagnosis not present

## 2022-05-26 DIAGNOSIS — Z9989 Dependence on other enabling machines and devices: Secondary | ICD-10-CM | POA: Diagnosis not present

## 2022-05-26 DIAGNOSIS — R1031 Right lower quadrant pain: Secondary | ICD-10-CM | POA: Diagnosis not present

## 2022-05-26 DIAGNOSIS — M6250 Muscle wasting and atrophy, not elsewhere classified, unspecified site: Secondary | ICD-10-CM | POA: Diagnosis not present

## 2022-05-26 DIAGNOSIS — E7800 Pure hypercholesterolemia, unspecified: Secondary | ICD-10-CM | POA: Diagnosis not present

## 2022-05-26 DIAGNOSIS — N179 Acute kidney failure, unspecified: Secondary | ICD-10-CM | POA: Diagnosis not present

## 2022-05-26 DIAGNOSIS — Z743 Need for continuous supervision: Secondary | ICD-10-CM | POA: Diagnosis not present

## 2022-05-26 DIAGNOSIS — Z7984 Long term (current) use of oral hypoglycemic drugs: Secondary | ICD-10-CM | POA: Diagnosis not present

## 2022-05-26 DIAGNOSIS — I7102 Dissection of abdominal aorta: Secondary | ICD-10-CM | POA: Diagnosis not present

## 2022-05-26 DIAGNOSIS — E039 Hypothyroidism, unspecified: Secondary | ICD-10-CM | POA: Diagnosis not present

## 2022-05-26 DIAGNOSIS — I708 Atherosclerosis of other arteries: Secondary | ICD-10-CM | POA: Diagnosis not present

## 2022-05-26 DIAGNOSIS — Z20822 Contact with and (suspected) exposure to covid-19: Secondary | ICD-10-CM | POA: Diagnosis not present

## 2022-05-26 DIAGNOSIS — R52 Pain, unspecified: Secondary | ICD-10-CM | POA: Diagnosis not present

## 2022-05-26 DIAGNOSIS — Z79899 Other long term (current) drug therapy: Secondary | ICD-10-CM | POA: Diagnosis not present

## 2022-05-26 DIAGNOSIS — I700 Atherosclerosis of aorta: Secondary | ICD-10-CM | POA: Diagnosis not present

## 2022-05-26 DIAGNOSIS — Z8639 Personal history of other endocrine, nutritional and metabolic disease: Secondary | ICD-10-CM | POA: Diagnosis not present

## 2022-05-26 DIAGNOSIS — I71011 Dissection of aortic arch: Secondary | ICD-10-CM | POA: Diagnosis not present

## 2022-05-26 DIAGNOSIS — Z951 Presence of aortocoronary bypass graft: Secondary | ICD-10-CM | POA: Diagnosis not present

## 2022-05-26 DIAGNOSIS — I808 Phlebitis and thrombophlebitis of other sites: Secondary | ICD-10-CM | POA: Diagnosis not present

## 2022-05-26 DIAGNOSIS — E119 Type 2 diabetes mellitus without complications: Secondary | ICD-10-CM | POA: Diagnosis not present

## 2022-05-26 DIAGNOSIS — J449 Chronic obstructive pulmonary disease, unspecified: Secondary | ICD-10-CM | POA: Diagnosis not present

## 2022-05-26 DIAGNOSIS — F17210 Nicotine dependence, cigarettes, uncomplicated: Secondary | ICD-10-CM | POA: Diagnosis not present

## 2022-05-26 DIAGNOSIS — K55069 Acute infarction of intestine, part and extent unspecified: Secondary | ICD-10-CM | POA: Diagnosis not present

## 2022-05-26 DIAGNOSIS — E079 Disorder of thyroid, unspecified: Secondary | ICD-10-CM | POA: Diagnosis not present

## 2022-05-27 LAB — HEMOGLOBIN A1C: Hemoglobin A1C: 6.7

## 2022-06-03 ENCOUNTER — Ambulatory Visit: Payer: Self-pay | Admitting: Internal Medicine

## 2022-06-03 NOTE — Progress Notes (Deleted)
   Subjective:    Patient ID: Morgan Wright, female    DOB: Apr 01, 1966, 56 y.o.   MRN: [id number]  HPI  Patient presents to clinic today with complaint of elevated blood pressures.  She has a history of HTN, currently managed on olmesartan and metoprolol.  Her BP today is.  There is no ECG on file.  Her comorbidities include HLD with CAD status post CABG and DM2.  Review of Systems     Objective:   Physical Exam        Assessment & Plan:

## 2022-06-04 ENCOUNTER — Telehealth: Payer: Self-pay

## 2022-06-04 NOTE — Telephone Encounter (Signed)
Copied from [place] [id number]. Topic: General - Inquiry >> Jun 04, 2022 10:12 AM Devoria Glassing wrote: Reason for CRM: pt states she went to [facility] ED and ended up in their hospital 09/30-10/08 then a bed opened up at [facility], she was transferred there, and d/c 10/10.  Pt would like to know who can fill out her FMLA and short term disability?  Pt says she kept asking at the hospital and no one had an answer.  She has been out of work 12 days and about to lose her job.

## 2022-06-04 NOTE — Telephone Encounter (Signed)
Hospitals can sometimes do disability, but usually they don't.  I cannot fill out disability paperwork without seeing the patient however. Please schedule or keep upcoming apt.  Morgan Wright, [facility] 06/04/2022, 12:43 PM

## 2022-06-09 ENCOUNTER — Ambulatory Visit (INDEPENDENT_AMBULATORY_CARE_PROVIDER_SITE_OTHER): Payer: Self-pay | Admitting: Family Medicine

## 2022-06-09 ENCOUNTER — Encounter: Payer: Self-pay | Admitting: Family Medicine

## 2022-06-09 VITALS — BP 148/64 | HR 62 | Ht 61.0 in | Wt 169.2 lb

## 2022-06-09 DIAGNOSIS — Z9889 Other specified postprocedural states: Secondary | ICD-10-CM

## 2022-06-09 DIAGNOSIS — I701 Atherosclerosis of renal artery: Secondary | ICD-10-CM

## 2022-06-09 DIAGNOSIS — N179 Acute kidney failure, unspecified: Secondary | ICD-10-CM | POA: Diagnosis not present

## 2022-06-09 NOTE — Progress Notes (Unsigned)
Subjective:    Patient ID: Morgan Wright, female    DOB: 06-17-1966, 56 y.o.   MRN: [id number]  Morgan Wright is a 56 y.o. female presenting on 06/09/2022 for Hospitalization Follow-up   [place] VISIT  Hospital/Location:  Initial ED [facility], Flank pain, Renal Failure, transfer to hospital  Date of Admission: 05/26/22 Date of Discharge: 06/03/22 Transitions of care telephone call: 06/04/22 Completed by Karlene Einstein RN  Reason for Admission: Flank Pain / Kidney Failure  [facility] H&P and Discharge Summary have been reviewed - Patient presents today 6 days after recent hospitalization. Brief summary of recent course, patient had symptoms of flank pain, and found to have acute renal ischemia R side and due to ASCVD severe atherosclerosis of abdominal aorta impacting her renal arteries. She proceeded with vascular to have a stent placed   Regarding anticoagulation, vascular has her on Eliquis 5mg  BID, Plavix 75mg  daily, Aspirin 81mg  daily, she is to stop Aspirin after 3 months. Continue others  Vascular surgery placed L renal artery stent on 10/9  - Today reports overall has done well after discharge. Symptoms of flank pain have resolved   - New medications on discharge: see above - Changes to current meds on discharge: switched statin to Rosuvastatin  I have reviewed the discharge medication list, and have reconciled the current and discharge medications today.   Current Outpatient Medications:    acetaminophen (TYLENOL) 325 MG tablet, Take by mouth., Disp: , Rfl:    albuterol (VENTOLIN HFA) 108 (90 Base) MCG/ACT inhaler, Inhale 2 puffs into the lungs every 4 (four) hours as needed., Disp: 18 g, Rfl: 0   amLODipine (NORVASC) 10 MG tablet, Take 1 tablet by mouth daily., Disp: , Rfl:    apixaban (ELIQUIS) 5 MG TABS tablet, Take by mouth., Disp: , Rfl:    aspirin EC 81 MG tablet, Take by mouth., Disp: , Rfl:    BRILINTA 90 MG TABS tablet, Take by mouth.,  Disp: , Rfl:    carvedilol (COREG) 25 MG tablet, Take 25 mg by mouth 2 (two) times daily., Disp: , Rfl:    chlorthalidone (HYGROTON) 25 MG tablet, Take 25 mg by mouth daily., Disp: , Rfl:    clopidogrel (PLAVIX) 75 MG tablet, Take 75 mg by mouth daily., Disp: , Rfl:    levothyroxine (SYNTHROID) 75 MCG tablet, Take 75 mcg by mouth daily., Disp: , Rfl:    metFORMIN (GLUCOPHAGE-XR) 750 MG 24 hr tablet, Take by mouth., Disp: , Rfl:    NICOTINE STEP 1 21 MG/24HR patch, 21 mg daily., Disp: , Rfl:    rosuvastatin (CRESTOR) 40 MG tablet, Take 1 tablet (40 mg total) by mouth at bedtime., Disp: 90 tablet, Rfl: 3   Spacer/Aero-Holding Chambers (AEROCHAMBER MV) inhaler, Use as instructed, Disp: 1 each, Rfl: 2  ------------------------------------------------------------------------- Social History   Tobacco Use   Smoking status: Every Day    Packs/day: 0.75    Years: 40.00    Total pack years: 30.00    Types: Cigarettes   Smokeless tobacco: Never  Vaping Use   Vaping Use: Never used  Substance Use Topics   Alcohol use: No   Drug use: Never    Review of Systems Per HPI unless specifically indicated above     Objective:    BP (!) 148/64 (BP Location: Left Arm, Cuff Size: Normal)   Pulse 62   Ht 5\' 1"  (1.549 m)   Wt 169 lb 3.2 oz (76.7 kg)  SpO2 100%   BMI 31.97 kg/m   Wt Readings from Last 3 Encounters:  06/09/22 169 lb 3.2 oz (76.7 kg)  03/26/22 174 lb 3.2 oz (79 kg)  11/05/21 170 lb (77.1 kg)    Physical Exam Vitals and nursing note reviewed.  Constitutional:      General: She is not in acute distress.    Appearance: She is well-developed. She is not diaphoretic.     Comments: Well-appearing, comfortable, cooperative  HENT:     Head: Normocephalic and atraumatic.  Eyes:     General:        Right eye: No discharge.        Left eye: No discharge.     Conjunctiva/sclera: Conjunctivae normal.  Neck:     Thyroid: No thyromegaly.  Cardiovascular:     Rate and Rhythm:  Normal rate and regular rhythm.     Heart sounds: Normal heart sounds. No murmur heard. Pulmonary:     Effort: Pulmonary effort is normal. No respiratory distress.     Breath sounds: Normal breath sounds. No wheezing or rales.  Musculoskeletal:        General: Normal range of motion.     Cervical back: Normal range of motion and neck supple.  Lymphadenopathy:     Cervical: No cervical adenopathy.  Skin:    General: Skin is warm and dry.     Findings: No erythema or rash.  Neurological:     Mental Status: She is alert and oriented to person, place, and time.  Psychiatric:        Behavior: Behavior normal.     Comments: Well groomed, good eye contact, normal speech and thoughts       Results for orders placed or performed in visit on 06/09/22  Hemoglobin A1c  Result Value Ref Range   Hemoglobin A1C 6.7       Assessment & Plan:   Problem List Items Addressed This Visit   None Visit Diagnoses     Atherosclerotic renal artery stenosis (HCC)    -  Primary   Relevant Medications   amLODipine (NORVASC) 10 MG tablet   apixaban (ELIQUIS) 5 MG TABS tablet   aspirin EC 81 MG tablet   carvedilol (COREG) 25 MG tablet   chlorthalidone (HYGROTON) 25 MG tablet   Other Relevant Orders   CBC with Differential/Platelet   COMPLETE METABOLIC PANEL WITH GFR   History of stent insertion of renal artery       Acute renal failure, unspecified acute renal failure type (HCC)       Relevant Orders   CBC with Differential/Platelet   COMPLETE METABOLIC PANEL WITH GFR       HFU S/p vascular renal artery stent Acute kidney failure  Improved clinically  Labs today, stay tuned for results. Seems like on discharge kidney function was improving.  Continue anticoag eliquis and DAPT ASA Plavix as indicated (3 months, then DC Aspirin) Follow w Vascular  BP seems stable at this time. No changes immediately today, they already changed all BP meds in hospital  Keep on Levothyroxine  daily, instead of  Okay cleared to return to work tomorrow. Letter written  No orders of the defined types were placed in this encounter.   Follow up plan: Return in about 4 weeks (around 07/07/2022), or if symptoms worsen or fail to improve, for 4 weeks follow-up HTN, Kidney function.  Saralyn Pilar, DO [facility] 06/09/2022, 2:33 PM

## 2022-06-09 NOTE — Patient Instructions (Addendum)
Thank you for coming to the office today.  Labs today, stay tuned for results.  Seems like on discharge kidney function was improving.  BP seems stable at this time. No changes immediately today, they already changed all BP meds in hospital  Keep on Levothyroxine 76mcg daily, instead of 8mcg  Okay cleared to return to work tomorrow.  Please schedule a Follow-up Appointment to: Return in about 4 weeks (around 07/07/2022), or if symptoms worsen or fail to improve, for 4 weeks follow-up HTN, Kidney function.  If you have any other questions or concerns, please feel free to call the office or send a message through [facility]. You may also schedule an earlier appointment if necessary.  Additionally, you may be receiving a survey about your experience at our office within a few days to 1 week by e-mail or mail. We value your feedback.  Nobie Putnam, DO [facility]

## 2022-06-10 ENCOUNTER — Encounter: Payer: Self-pay | Admitting: Family Medicine

## 2022-06-10 LAB — CBC WITH DIFFERENTIAL/PLATELET
Absolute Monocytes: 924 cells/uL (ref 200–950)
Basophils Absolute: 126 cells/uL (ref 0–200)
Basophils Relative: 0.9 %
Eosinophils Absolute: 224 cells/uL (ref 15–500)
Eosinophils Relative: 1.6 %
HCT: 40.6 % (ref 35.0–45.0)
Hemoglobin: 13.9 g/dL (ref 11.7–15.5)
Lymphs Abs: 3304 cells/uL (ref 850–3900)
MCH: 29.8 pg (ref 27.0–33.0)
MCHC: 34.2 g/dL (ref 32.0–36.0)
MCV: 87.1 fL (ref 80.0–100.0)
MPV: 10 fL (ref 7.5–12.5)
Monocytes Relative: 6.6 %
Neutro Abs: 9422 cells/uL — ABNORMAL HIGH (ref 1500–7800)
Neutrophils Relative %: 67.3 %
Platelets: 322 10*3/uL (ref 140–400)
RBC: 4.66 10*6/uL (ref 3.80–5.10)
RDW: 13 % (ref 11.0–15.0)
Total Lymphocyte: 23.6 %
WBC: 14 10*3/uL — ABNORMAL HIGH (ref 3.8–10.8)

## 2022-06-10 LAB — COMPLETE METABOLIC PANEL WITH GFR
AG Ratio: 1.6 (calc) (ref 1.0–2.5)
ALT: 26 U/L (ref 6–29)
AST: 12 U/L (ref 10–35)
Albumin: 4.4 g/dL (ref 3.6–5.1)
Alkaline phosphatase (APISO): 70 U/L (ref 37–153)
BUN/Creatinine Ratio: 25 (calc) — ABNORMAL HIGH (ref 6–22)
BUN: 27 mg/dL — ABNORMAL HIGH (ref 7–25)
CO2: 31 mmol/L (ref 20–32)
Calcium: 10.5 mg/dL — ABNORMAL HIGH (ref 8.6–10.4)
Chloride: 97 mmol/L — ABNORMAL LOW (ref 98–110)
Creat: 1.08 mg/dL — ABNORMAL HIGH (ref 0.50–1.03)
Globulin: 2.7 g/dL (calc) (ref 1.9–3.7)
Glucose, Bld: 101 mg/dL — ABNORMAL HIGH (ref 65–99)
Potassium: 3.8 mmol/L (ref 3.5–5.3)
Sodium: 138 mmol/L (ref 135–146)
Total Bilirubin: 0.5 mg/dL (ref 0.2–1.2)
Total Protein: 7.1 g/dL (ref 6.1–8.1)
eGFR: 60 mL/min/{1.73_m2} (ref >=60)

## 2022-06-17 DIAGNOSIS — F17200 Nicotine dependence, unspecified, uncomplicated: Secondary | ICD-10-CM | POA: Diagnosis not present

## 2022-06-23 ENCOUNTER — Other Ambulatory Visit: Payer: Self-pay | Admitting: Physician Assistant

## 2022-06-23 ENCOUNTER — Telehealth: Payer: Self-pay | Admitting: Family Medicine

## 2022-06-23 ENCOUNTER — Encounter: Payer: Self-pay | Admitting: Physician Assistant

## 2022-06-23 ENCOUNTER — Ambulatory Visit: Payer: Self-pay

## 2022-06-23 ENCOUNTER — Ambulatory Visit (INDEPENDENT_AMBULATORY_CARE_PROVIDER_SITE_OTHER): Payer: BC Managed Care – PPO | Admitting: Physician Assistant

## 2022-06-23 VITALS — BP 111/64 | HR 71 | Temp 98.1°F | Ht 61.0 in | Wt 163.0 lb

## 2022-06-23 DIAGNOSIS — R5383 Other fatigue: Secondary | ICD-10-CM | POA: Diagnosis not present

## 2022-06-23 NOTE — Telephone Encounter (Signed)
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   The patient has been previously directed to contact their PCP if they begin to experience increased drowsiness   The patient began to experience the symptoms on 06/20/22   The patient shares that they have been tired as well as experiencing hot and cold flashes   Please contact further when possible          Chief Complaint: moderate fatigue, weakness Symptoms: "feels drained", food does not taste right., chronic diarrhea  Frequency: 06/20/22 Pertinent Negatives: Patient denies chest pain, fever, cough, SOB, vomiting, diarrhea, bleeding Disposition: [] ED /[] Urgent Care (no appt availability in office) / [x] Appointment(In office/virtual)/ []  [facility]/ [] Home Care/ [] Refused Recommended Disposition /[] [facility]/ []  Follow-up with PCP Additional Notes: scheduled appt- today at 1420.  Reason for Disposition  [1] MODERATE weakness (i.e., interferes with work, school, normal activities) AND [2] cause unknown  (Exceptions: Weakness from acute minor illness or poor fluid intake; weakness is chronic and not worse.)  Answer Assessment - Initial Assessment Questions 1. DESCRIPTION: "Describe how you are feeling."     Feel drained 2. SEVERITY: "How bad is it?"  "Can you stand and walk?"   - MILD (0-3): Feels weak or tired, but does not interfere with work, school or normal activities.   - MODERATE (4-7): Able to stand and walk; weakness interferes with work, school, or normal activities.   - SEVERE (8-10): Unable to stand or walk; unable to do usual activities.     moderate 3. ONSET: "When did these symptoms begin?" (e.g., hours, days, weeks, months)     06/20/22 4. CAUSE: "What do you think is causing the weakness or fatigue?" (e.g., not drinking enough fluids, medical problem, trouble  sleeping)     Friday didn't taste right-  5. NEW MEDICINES:  "Have you started on any new medicines recently?" (e.g., opioid pain medicines, benzodiazepines, muscle relaxants, antidepressants, antihistamines, neuroleptics, beta blockers)     no 6. OTHER SYMPTOMS: "Do you have any other symptoms?" (e.g., chest pain, fever, cough, SOB, vomiting, diarrhea, bleeding, other areas of pain)     Chronic diarrhea 7. PREGNANCY: "Is there any chance you are pregnant?" "When was your last menstrual period?"     N/a  Protocols used: Weakness (Generalized) and Fatigue-A-AH

## 2022-06-23 NOTE — Telephone Encounter (Signed)
Pt is calling to request a letter stating that she received a covid test. Please advise CB- [phone] Pt is fine with the letter being uploaded into her mychart acct.

## 2022-06-23 NOTE — Patient Instructions (Addendum)
I am concerned for your fatigue and overall not feeling well We will keep you updated on the results of your labs but please have a low threshold for going to the ED for evaluation especially since you just had surgery  Make sure you are staying well hydrated and let us know if you start to feel worse.

## 2022-06-23 NOTE — Progress Notes (Unsigned)
Acute Office Visit   Patient: Morgan Wright   DOB: 1965-09-08   56 y.o. Female  MRN: [id number] Visit Date: 06/23/2022  Today's healthcare provider: Dani Gobble Mecum, PA-C  Introduced myself to the patient as a Journalist, newspaper and provided education on APPs in clinical practice.    Chief Complaint  Patient presents with   Fatigue   Subjective    HPI    Reports she has been feeling extremely tired since Friday afternoon Reports she has mostly slept since then Reports decreased appetite and altered taste sensation  Reports occasional coughing She states she has had intermittent chills and sweats over the weekend and continuing today     Medications: Outpatient Medications Prior to Visit  Medication Sig   albuterol (VENTOLIN HFA) 108 (90 Base) MCG/ACT inhaler Inhale 2 puffs into the lungs every 4 (four) hours as needed.   amLODipine (NORVASC) 10 MG tablet Take 1 tablet by mouth daily.   apixaban (ELIQUIS) 5 MG TABS tablet Take by mouth.   aspirin EC 81 MG tablet Take by mouth.   BRILINTA 90 MG TABS tablet Take by mouth.   carvedilol (COREG) 25 MG tablet Take 25 mg by mouth 2 (two) times daily.   chlorthalidone (HYGROTON) 25 MG tablet Take 25 mg by mouth daily.   clopidogrel (PLAVIX) 75 MG tablet Take 75 mg by mouth daily.   levothyroxine (SYNTHROID) 75 MCG tablet Take 75 mcg by mouth daily.   metFORMIN (GLUCOPHAGE-XR) 750 MG 24 hr tablet Take by mouth.   NICOTINE STEP 1 21 MG/24HR patch 21 mg daily.   rosuvastatin (CRESTOR) 40 MG tablet Take 1 tablet (40 mg total) by mouth at bedtime.   Spacer/Aero-Holding Chambers (AEROCHAMBER MV) inhaler Use as instructed   No facility-administered medications prior to visit.    Review of Systems  Constitutional:  Positive for chills, diaphoresis and fatigue. Negative for fever.  Respiratory:  Positive for cough. Negative for chest tightness and shortness of breath.   Cardiovascular:  Negative for chest pain and leg swelling.   Gastrointestinal:  Positive for diarrhea (chronic and ongoing). Negative for abdominal distention, abdominal pain, blood in stool, nausea and vomiting.  Genitourinary:  Negative for difficulty urinating and dysuria.  Neurological:  Positive for weakness and light-headedness. Negative for dizziness and headaches.  Hematological:  Negative for adenopathy. Does not bruise/bleed easily.       Objective    BP 111/64   Pulse 71   Temp 98.1 F (36.7 C) (Oral)   Ht 5' 1" (1.549 m)   Wt 163 lb (73.9 kg)   SpO2 100%   BMI 30.80 kg/m    Physical Exam Vitals reviewed.  Constitutional:      General: She is awake.     Appearance: Normal appearance. She is well-developed and well-groomed. She is ill-appearing.  HENT:     Head: Normocephalic and atraumatic.     Mouth/Throat:     Tongue: No lesions.     Pharynx: Oropharynx is clear. Uvula midline. No oropharyngeal exudate or posterior oropharyngeal erythema.  Eyes:     General: Lids are normal. Gaze aligned appropriately.     Extraocular Movements: Extraocular movements intact.     Conjunctiva/sclera: Conjunctivae normal.     Pupils: Pupils are equal, round, and reactive to light.  Cardiovascular:     Rate and Rhythm: Normal rate and regular rhythm.     Pulses: Normal pulses.  Radial pulses are 2+ on the right side and 2+ on the left side.     Heart sounds: Normal heart sounds. No murmur heard.    No friction rub. No gallop.  Pulmonary:     Effort: Pulmonary effort is normal.     Breath sounds: Normal breath sounds. No decreased air movement. No decreased breath sounds, wheezing, rhonchi or rales.  Musculoskeletal:     Cervical back: Normal range of motion.  Lymphadenopathy:     Head:     Right side of head: No submental, submandibular or preauricular adenopathy.     Left side of head: No submental, submandibular or preauricular adenopathy.     Cervical:     Right cervical: No superficial or posterior cervical  adenopathy.    Left cervical: No superficial or posterior cervical adenopathy.  Neurological:     Mental Status: She is lethargic.  Psychiatric:        Behavior: Behavior is cooperative.       Results for orders placed or performed in visit on 06/23/22  TEST AUTHORIZATION  Result Value Ref Range   TEST NAME: COVID, FLU, RSV    TEST CODE: 39,816    CLIENT CONTACT: EMILY    REPORT ALWAYS MESSAGE SIGNATURE    Results for orders placed or performed in visit on 06/23/22  CBC w/Diff/Platelet  Result Value Ref Range   WBC 7.0 3.8 - 10.8 Thousand/uL   RBC 4.66 3.80 - 5.10 Million/uL   Hemoglobin 13.4 11.7 - 15.5 g/dL   HCT 39.6 35.0 - 45.0 %   MCV 85.0 80.0 - 100.0 fL   MCH 28.8 27.0 - 33.0 pg   MCHC 33.8 32.0 - 36.0 g/dL   RDW 13.1 11.0 - 15.0 %   Platelets 177 140 - 400 Thousand/uL   MPV 10.9 7.5 - 12.5 fL   Neutro Abs 4,900 1,500 - 7,800 cells/uL   Lymphs Abs 1,113 850 - 3,900 cells/uL   Absolute Monocytes 714 200 - 950 cells/uL   Eosinophils Absolute 210 15 - 500 cells/uL   Basophils Absolute 63 0 - 200 cells/uL   Neutrophils Relative % 70 %   Total Lymphocyte 15.9 %   Monocytes Relative 10.2 %   Eosinophils Relative 3.0 %   Basophils Relative 0.9 %  COMPLETE METABOLIC PANEL WITH GFR  Result Value Ref Range   Glucose, Bld 156 (H) 65 - 139 mg/dL   BUN 28 (H) 7 - 25 mg/dL   Creat 1.05 (H) 0.50 - 1.03 mg/dL   eGFR 62 > OR = 60 mL/min/1.21m   BUN/Creatinine Ratio 27 (H) 6 - 22 (calc)   Sodium 130 (L) 135 - 146 mmol/L   Potassium 3.1 (L) 3.5 - 5.3 mmol/L   Chloride 89 (L) 98 - 110 mmol/L   CO2 24 20 - 32 mmol/L   Calcium 9.6 8.6 - 10.4 mg/dL   Total Protein 7.3 6.1 - 8.1 g/dL   Albumin 4.2 3.6 - 5.1 g/dL   Globulin 3.1 1.9 - 3.7 g/dL (calc)   AG Ratio 1.4 1.0 - 2.5 (calc)   Total Bilirubin 1.3 (H) 0.2 - 1.2 mg/dL   Alkaline phosphatase (APISO) 51 37 - 153 U/L   AST 33 10 - 35 U/L   ALT 37 (H) 6 - 29 U/L  C-reactive protein  Result Value Ref Range   CRP 56.0 (H)  <8.0 mg/L    Assessment & Plan      No follow-ups on file.  Problem List Items Addressed This Visit       Other   Fatigue - Primary    Acute, new concern Reports increased fatigue since Friday afternoon and altered taste sensation as well as reduced appetite Will check CBC, CMP, CRP, lactate as well as COVID testing  Patient had stent placed in right renal artery earlier this month  CBC was overall normal but CMP demonstrates several electrolyte changes: hypokalemia, hyponatremia, low chloride- potential for dehydration given decreased PO intake CRP was elevated but CBC was normal- CRP may be leftover from recent surgery -recommend trending for monitoring Will await results of COVID testing and lactate but recommend patient replenish electrolytes and increase PO intake to assist with symptoms at this time.  Discuss ED and return precautions - she verbalized understanding Follow up in 1 week to repeat labs for monitoring      Relevant Orders   CBC w/Diff/Platelet (Completed)   COMPLETE METABOLIC PANEL WITH GFR (Completed)   C-reactive protein   Lactate dehydrogenase   COVID-19, Flu A+B and RSV   C-reactive protein (Completed)     No follow-ups on file.   I, Erin E Mecum, PA-C, have reviewed all documentation for this visit. The documentation on 06/24/22 for the exam, diagnosis, procedures, and orders are all accurate and complete.   Talitha Givens, MHS, PA-C [facility]

## 2022-06-24 DIAGNOSIS — R5383 Other fatigue: Secondary | ICD-10-CM | POA: Insufficient documentation

## 2022-06-24 LAB — CBC WITH DIFFERENTIAL/PLATELET
Absolute Monocytes: 714 cells/uL (ref 200–950)
Basophils Absolute: 63 cells/uL (ref 0–200)
Basophils Relative: 0.9 %
Eosinophils Absolute: 210 cells/uL (ref 15–500)
Eosinophils Relative: 3 %
HCT: 39.6 % (ref 35.0–45.0)
Hemoglobin: 13.4 g/dL (ref 11.7–15.5)
Lymphs Abs: 1113 cells/uL (ref 850–3900)
MCH: 28.8 pg (ref 27.0–33.0)
MCHC: 33.8 g/dL (ref 32.0–36.0)
MCV: 85 fL (ref 80.0–100.0)
MPV: 10.9 fL (ref 7.5–12.5)
Monocytes Relative: 10.2 %
Neutro Abs: 4900 cells/uL (ref 1500–7800)
Neutrophils Relative %: 70 %
Platelets: 177 10*3/uL (ref 140–400)
RBC: 4.66 10*6/uL (ref 3.80–5.10)
RDW: 13.1 % (ref 11.0–15.0)
Total Lymphocyte: 15.9 %
WBC: 7 10*3/uL (ref 3.8–10.8)

## 2022-06-24 LAB — COMPLETE METABOLIC PANEL WITH GFR
AG Ratio: 1.4 (calc) (ref 1.0–2.5)
ALT: 37 U/L — ABNORMAL HIGH (ref 6–29)
AST: 33 U/L (ref 10–35)
Albumin: 4.2 g/dL (ref 3.6–5.1)
Alkaline phosphatase (APISO): 51 U/L (ref 37–153)
BUN/Creatinine Ratio: 27 (calc) — ABNORMAL HIGH (ref 6–22)
BUN: 28 mg/dL — ABNORMAL HIGH (ref 7–25)
CO2: 24 mmol/L (ref 20–32)
Calcium: 9.6 mg/dL (ref 8.6–10.4)
Chloride: 89 mmol/L — ABNORMAL LOW (ref 98–110)
Creat: 1.05 mg/dL — ABNORMAL HIGH (ref 0.50–1.03)
Globulin: 3.1 g/dL (calc) (ref 1.9–3.7)
Glucose, Bld: 156 mg/dL — ABNORMAL HIGH (ref 65–139)
Potassium: 3.1 mmol/L — ABNORMAL LOW (ref 3.5–5.3)
Sodium: 130 mmol/L — ABNORMAL LOW (ref 135–146)
Total Bilirubin: 1.3 mg/dL — ABNORMAL HIGH (ref 0.2–1.2)
Total Protein: 7.3 g/dL (ref 6.1–8.1)
eGFR: 62 mL/min/{1.73_m2} (ref >=60)

## 2022-06-24 LAB — C-REACTIVE PROTEIN: CRP: 56 mg/L — ABNORMAL HIGH (ref <8.0)

## 2022-06-24 LAB — TEST AUTHORIZATION: TEST CODE:: 39816

## 2022-06-24 LAB — SARS-COV-2 RNA, INFLUENZA A/B, AND RSV RNA, QUALITATIVE NAAT
INFLUENZA A RNA: NOT DETECTED
INFLUENZA B RNA: NOT DETECTED
RSV RNA: NOT DETECTED
SARS COV2 RNA: NOT DETECTED

## 2022-06-24 NOTE — Telephone Encounter (Signed)
I spoke with the patient and a letter has been sent to her MyChart.

## 2022-06-24 NOTE — Assessment & Plan Note (Addendum)
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  Discuss ED and return precautions - she verbalized understanding Follow up in 1 week to repeat labs for monitoring

## 2022-06-26 DIAGNOSIS — E079 Disorder of thyroid, unspecified: Secondary | ICD-10-CM | POA: Diagnosis not present

## 2022-06-26 DIAGNOSIS — E876 Hypokalemia: Secondary | ICD-10-CM | POA: Diagnosis not present

## 2022-06-26 DIAGNOSIS — R7989 Other specified abnormal findings of blood chemistry: Secondary | ICD-10-CM | POA: Diagnosis not present

## 2022-06-26 DIAGNOSIS — R946 Abnormal results of thyroid function studies: Secondary | ICD-10-CM | POA: Diagnosis not present

## 2022-06-26 DIAGNOSIS — R197 Diarrhea, unspecified: Secondary | ICD-10-CM | POA: Diagnosis not present

## 2022-06-26 DIAGNOSIS — F17210 Nicotine dependence, cigarettes, uncomplicated: Secondary | ICD-10-CM | POA: Diagnosis not present

## 2022-06-26 DIAGNOSIS — Z7982 Long term (current) use of aspirin: Secondary | ICD-10-CM | POA: Diagnosis not present

## 2022-06-26 DIAGNOSIS — I2510 Atherosclerotic heart disease of native coronary artery without angina pectoris: Secondary | ICD-10-CM | POA: Diagnosis not present

## 2022-06-26 DIAGNOSIS — E860 Dehydration: Secondary | ICD-10-CM | POA: Diagnosis not present

## 2022-06-26 DIAGNOSIS — Z7984 Long term (current) use of oral hypoglycemic drugs: Secondary | ICD-10-CM | POA: Diagnosis not present

## 2022-06-26 DIAGNOSIS — Z79890 Hormone replacement therapy (postmenopausal): Secondary | ICD-10-CM | POA: Diagnosis not present

## 2022-06-26 DIAGNOSIS — Z7902 Long term (current) use of antithrombotics/antiplatelets: Secondary | ICD-10-CM | POA: Diagnosis not present

## 2022-06-26 DIAGNOSIS — E119 Type 2 diabetes mellitus without complications: Secondary | ICD-10-CM | POA: Diagnosis not present

## 2022-06-26 DIAGNOSIS — Z7901 Long term (current) use of anticoagulants: Secondary | ICD-10-CM | POA: Diagnosis not present

## 2022-06-26 DIAGNOSIS — R5383 Other fatigue: Secondary | ICD-10-CM | POA: Diagnosis not present

## 2022-06-26 DIAGNOSIS — E7800 Pure hypercholesterolemia, unspecified: Secondary | ICD-10-CM | POA: Diagnosis not present

## 2022-06-26 DIAGNOSIS — I10 Essential (primary) hypertension: Secondary | ICD-10-CM | POA: Diagnosis not present

## 2022-06-28 DIAGNOSIS — R5383 Other fatigue: Secondary | ICD-10-CM | POA: Diagnosis not present

## 2022-07-01 ENCOUNTER — Telehealth: Payer: Self-pay | Admitting: Family Medicine

## 2022-07-01 ENCOUNTER — Telehealth: Payer: Self-pay

## 2022-07-01 NOTE — Telephone Encounter (Signed)
Brittney with [facility] case manager would like the nurse to call her back regarding this patient.  Pt has had some recent hospital visits.   She wants to make sure that she is following up on the labs she had done in her last visit. Marland Kitchen

## 2022-07-01 NOTE — Telephone Encounter (Signed)
he pt was concerned that we didn't follow up with her with her lab results. I informed the patient that the provider did notify her on 06/24/22 @ 10:07 a.m. She sent her a Pharmacist, community message stating, "  Your labs demonstrate some low electrolyte levels - this is not surprising given your reduced appetite but this can also contribute to your fatigue? Please try to increase your oral intake and eat regularly- I recommend using something like Pedialyte or Gatorade to assist with your hydration efforts." The pt informed me that she didn't check her Mychart message. She said she felt like we should have called her with abnormal results. Appt scheduled for Friday, Nov. 10th to follow up from ER visit.

## 2022-07-01 NOTE — Telephone Encounter (Signed)
Transition Care Management Follow-up Telephone Call Date of discharge and from where: 06/26/2022 How have you been since you were released from the hospital? yes Any questions or concerns? The pt was concerned that we didn't follow up with her with her lab results. I informed the patient that the provider did notify her on 06/24/22 @ 10:07 a.m. She sent her a Pharmacist, community message stating, "  Your labs demonstrate some low electrolyte levels - this is not surprising given your reduced appetite but this can also contribute to your fatigue? Please try to increase your oral intake and eat regularly- I recommend using something like Pedialyte or Gatorade to assist with your hydration efforts." The pt informed me that she didn't check her Mychart message. She said she felt like we should have called her with abnormal results. Appt scheduled for Friday, Nov. 10th to follow up from ER visit. Items Reviewed: Did the pt receive and understand the discharge instructions provided? Yes  Medications obtained and verified? Yes  Other? No  Any new allergies since your discharge? No  Dietary orders reviewed? Yes Do you have support at home? Yes   Home Care and Equipment/Supplies: Were home health services ordered? no If so, what is the name of the agency? Not applicable   Has the agency set up a time to come to the patient's home? not applicable Were any new equipment or medical supplies ordered?  No What is the name of the medical supply agency? Not applicable  Were you able to get the supplies/equipment? not applicable Do you have any questions related to the use of the equipment or supplies? No  Functional Questionnaire: (I = Independent and D = Dependent) ADLs: I  Bathing/Dressing- I  Meal Prep- I  Eating- I  Maintaining continence- I  Transferring/Ambulation- I  Managing Meds- I  Follow up appointments reviewed:  PCP Hospital f/u appt confirmed? Yes  Scheduled to see Jeanice Lim, DO  on Friday, November 10th @ 02:40pm. [facility] f/u appt confirmed? No  Scheduled No. Are transportation arrangements needed? No  If their condition worsens, is the pt aware to call PCP or go to the Emergency Dept.? Yes Was the patient provided with contact information for the PCP's office or ED? Yes Was to pt encouraged to call back with questions or concerns? Yes

## 2022-07-02 ENCOUNTER — Other Ambulatory Visit: Payer: Self-pay | Admitting: Family Medicine

## 2022-07-02 ENCOUNTER — Other Ambulatory Visit: Payer: Self-pay

## 2022-07-02 ENCOUNTER — Ambulatory Visit: Payer: Self-pay | Admitting: *Deleted

## 2022-07-02 DIAGNOSIS — I2510 Atherosclerotic heart disease of native coronary artery without angina pectoris: Secondary | ICD-10-CM

## 2022-07-02 DIAGNOSIS — I10 Essential (primary) hypertension: Secondary | ICD-10-CM

## 2022-07-02 MED ORDER — CHLORTHALIDONE 25 MG PO TABS: 25.0000 mg | ORAL_TABLET | Freq: Every day | ORAL | 1 refills | Status: DC

## 2022-07-02 MED ORDER — APIXABAN 5 MG PO TABS: 5.0000 mg | ORAL_TABLET | Freq: Two times a day (BID) | ORAL | 3 refills | Status: DC

## 2022-07-02 MED ORDER — CARVEDILOL 25 MG PO TABS: 25.0000 mg | ORAL_TABLET | Freq: Two times a day (BID) | ORAL | 1 refills | Status: DC

## 2022-07-02 MED ORDER — AMLODIPINE BESYLATE 10 MG PO TABS: 10.0000 mg | ORAL_TABLET | Freq: Every day | ORAL | 11 refills | Status: DC

## 2022-07-02 NOTE — Telephone Encounter (Signed)
Spoke with the patient.   She mentioned that she will be out of some medications Thursday... amlodipine, carvedilol, chlorthalidone, and eliquis.  I have sent those four in for her so she doesn't run out.  She is aware of her follow up on Friday.

## 2022-07-02 NOTE — Telephone Encounter (Signed)
A user error has taken place: encounter opened in error, closed for administrative reasons.

## 2022-07-02 NOTE — Telephone Encounter (Signed)
Requested medication (s) are due for refill today: yes  Requested medication (s) are on the active medication list: yes  Last refill:  06/09/22  Future visit scheduled: yes  Notes to clinic:  Unable to refill per protocol, last refill by another provider. Historical provider, routing for review.     Requested Prescriptions  Pending Prescriptions Disp Refills   apixaban (ELIQUIS) 5 MG TABS tablet 60 tablet     Sig: Take by mouth.     Hematology:  Anticoagulants - apixaban Failed - 07/02/2022 11:40 AM      Failed - Cr in normal range and within 360 days    Creat  Date Value Ref Range Status  06/23/2022 1.05 (H) 0.50 - 1.03 mg/dL Final   Creatinine, Urine  Date Value Ref Range Status  03/26/2022 78 20 - 275 mg/dL Final         Failed - ALT in normal range and within 360 days    ALT  Date Value Ref Range Status  06/23/2022 37 (H) 6 - 29 U/L Final   SGPT (ALT)  Date Value Ref Range Status  06/15/2012 25 12 - 78 U/L Final         Passed - PLT in normal range and within 360 days    Platelets  Date Value Ref Range Status  06/23/2022 177 140 - 400 Thousand/uL Final   Platelet  Date Value Ref Range Status  06/15/2012 295 150 - 440 x10 3/mm 3 Final         Passed - HGB in normal range and within 360 days    Hemoglobin  Date Value Ref Range Status  06/23/2022 13.4 11.7 - 15.5 g/dL Final   HGB  Date Value Ref Range Status  06/15/2012 16.0 12.0 - 16.0 g/dL Final         Passed - HCT in normal range and within 360 days    HCT  Date Value Ref Range Status  06/23/2022 39.6 35.0 - 45.0 % Final  06/15/2012 46.6 35.0 - 47.0 % Final         Passed - AST in normal range and within 360 days    AST  Date Value Ref Range Status  06/23/2022 33 10 - 35 U/L Final   SGOT(AST)  Date Value Ref Range Status  06/15/2012 14 (L) 15 - 37 Unit/L Final         Passed - Valid encounter within last 12 months    Recent Outpatient Visits           1 week ago Fatigue, unspecified  type   [facility] Mecum, Varnell E, PA-C   3 weeks ago Atherosclerotic renal artery stenosis [facility])   [facility] Smitty Cords, DO   3 months ago Annual physical exam   [facility] Smitty Cords, DO   7 months ago Type 2 diabetes mellitus with other specified complication, without long-term current use of insulin [facility])   [facility], Netta Neat, DO   3 years ago Type 2 diabetes mellitus with other specified complication, without long-term current use of insulin (HCC)   [facility] Althea Charon, Netta Neat, DO       Future Appointments             In 2 days Althea Charon, Netta Neat, DO [facility]   In 2 months Althea Charon, Netta Neat, DO [facility]  amLODipine (NORVASC) 10 MG tablet 30 tablet 11    Sig: Take 1 tablet (10 mg total) by mouth daily.     Cardiovascular: Calcium Channel Blockers 2 Passed - 07/02/2022 11:40 AM      Passed - Last BP in normal range    BP Readings from Last 1 Encounters:  06/23/22 111/64         Passed - Last Heart Rate in normal range    Pulse Readings from Last 1 Encounters:  06/23/22 71         Passed - Valid encounter within last 6 months    Recent Outpatient Visits           1 week ago Fatigue, unspecified type   [facility] Mecum, [place] E, PA-C   3 weeks ago Atherosclerotic renal artery stenosis [facility])   [facility] Smitty Cords, DO   3 months ago Annual physical exam   [facility] Smitty Cords, DO   7 months ago Type 2 diabetes mellitus with other specified complication, without long-term current use of insulin (HCC)   [facility], Netta Neat, DO   3 years ago Type 2 diabetes mellitus with other specified complication, without long-term current use of insulin (HCC)    [facility] Althea Charon, Netta Neat, DO       Future Appointments             In 2 days Althea Charon, Netta Neat, DO [facility]   In 2 months Althea Charon, Netta Neat, DO [facility]             carvedilol (COREG) 25 MG tablet      Sig: Take 1 tablet (25 mg total) by mouth 2 (two) times daily.     Cardiovascular: Beta Blockers 3 Failed - 07/02/2022 11:40 AM      Failed - Cr in normal range and within 360 days    Creat  Date Value Ref Range Status  06/23/2022 1.05 (H) 0.50 - 1.03 mg/dL Final   Creatinine, Urine  Date Value Ref Range Status  03/26/2022 78 20 - 275 mg/dL Final         Failed - ALT in normal range and within 360 days    ALT  Date Value Ref Range Status  06/23/2022 37 (H) 6 - 29 U/L Final   SGPT (ALT)  Date Value Ref Range Status  06/15/2012 25 12 - 78 U/L Final         Passed - AST in normal range and within 360 days    AST  Date Value Ref Range Status  06/23/2022 33 10 - 35 U/L Final   SGOT(AST)  Date Value Ref Range Status  06/15/2012 14 (L) 15 - 37 Unit/L Final         Passed - Last BP in normal range    BP Readings from Last 1 Encounters:  06/23/22 111/64         Passed - Last Heart Rate in normal range    Pulse Readings from Last 1 Encounters:  06/23/22 71         Passed - Valid encounter within last 6 months    Recent Outpatient Visits           1 week ago Fatigue, unspecified type   [facility] Mecum, [place] E, PA-C   3 weeks ago Atherosclerotic renal artery  stenosis [facility])   [facility] Althea Charon, Netta Neat, DO   3 months ago Annual physical exam   [facility], Netta Neat, DO   7 months ago Type 2 diabetes mellitus with other specified complication, without long-term current use of insulin [facility])   [facility], Netta Neat, DO   3 years ago Type 2 diabetes mellitus with other  specified complication, without long-term current use of insulin (HCC)   [facility] Althea Charon, Netta Neat, DO       Future Appointments             In 2 days Althea Charon Netta Neat, DO [facility]   In 2 months Althea Charon, Netta Neat, DO [facility], [facility]             chlorthalidone (HYGROTON) 25 MG tablet      Sig: Take 1 tablet (25 mg total) by mouth daily.     Cardiovascular: Diuretics - Thiazide Failed - 07/02/2022 11:40 AM      Failed - Cr in normal range and within 180 days    Creat  Date Value Ref Range Status  06/23/2022 1.05 (H) 0.50 - 1.03 mg/dL Final   Creatinine, Urine  Date Value Ref Range Status  03/26/2022 78 20 - 275 mg/dL Final         Failed - K in normal range and within 180 days    Potassium  Date Value Ref Range Status  06/23/2022 3.1 (L) 3.5 - 5.3 mmol/L Final  06/15/2012 4.3 3.5 - 5.1 mmol/L Final         Failed - Na in normal range and within 180 days    Sodium  Date Value Ref Range Status  06/23/2022 130 (L) 135 - 146 mmol/L Final  06/15/2012 133 (L) 136 - 145 mmol/L Final         Passed - Last BP in normal range    BP Readings from Last 1 Encounters:  06/23/22 111/64         Passed - Valid encounter within last 6 months    Recent Outpatient Visits           1 week ago Fatigue, unspecified type   [facility] Mecum, [place] E, PA-C   3 weeks ago Atherosclerotic renal artery stenosis [facility])   [facility] Smitty Cords, DO   3 months ago Annual physical exam   [facility] Smitty Cords, DO   7 months ago Type 2 diabetes mellitus with other specified complication, without long-term current use of insulin [facility])   [facility], Netta Neat, DO   3 years ago Type 2 diabetes mellitus with other specified complication, without long-term current use of insulin (HCC)   [facility] Althea Charon, Netta Neat, DO       Future Appointments             In 2 days Althea Charon, Netta Neat, DO [facility]   In 2 months Althea Charon, Netta Neat, DO [facility]

## 2022-07-02 NOTE — Telephone Encounter (Signed)
Summary: Medication Managment   Patient picked up 2 prescriptions yesterday and one is not on her medication list olmesartan, patient unsure if she should take medication.        Attempted to call patient- no answer on number provider- cal;led cell number. Reason for Disposition  [1] Caller has URGENT medicine question about med that PCP or specialist prescribed AND [2] triager unable to answer question  Answer Assessment - Initial Assessment Questions 1. NAME of MEDICINE: "What medicine(s) are you calling about?"     Olmesartan and Potasium  2. QUESTION: "What is your question?" (e.g., double dose of medicine, side effect)     Olmesartin- this is not current on medication list-patient was told at pharmacy she has 2 medications she could fill- she did not think to ask what they were Potasium-patient was given #5 pills from hospital visit- she would like to continue she feels it has helped her- can she get RF? 3. PRESCRIBER: "Who prescribed the medicine?" Reason: if prescribed by specialist, call should be referred to that group.     PCP/hospital provider 1. Olmesartin was filled by pharmacy- patient is not going to take- it is not current on list 2. Potasium- patient requesting RF from hospital Rx- she feels this is helping with her weakness- will ask provider if appropriate  Protocols used: Medication Question Call-A-AH

## 2022-07-02 NOTE — Telephone Encounter (Signed)
Please notify her  Potassium - yes that is fine to take for now, we can re-eval on Friday.  Olmesartan - the situation here is that I switched her to this back in August 2023. In the hospital more recently, they HELD this medication and took her off of it until her Kidney function returned to normal.  She can remain off for next few days until we talk again in office.  Saralyn Pilar, DO [facility] 07/02/2022, 11:01 AM

## 2022-07-02 NOTE — Telephone Encounter (Signed)
Medication Refill - Medication: Patient states she will run out tomorrow and she assumed when she saw PCP last all medications would be refilled.    apixaban (ELIQUIS) 5 MG TABS tablet  amLODipine (NORVASC) 10 MG tablet  carvedilol (COREG) 25 MG tablet  chlorthalidone (HYGROTON) 25 MG tablet    Has the patient contacted their pharmacy? Yes.    (Agent: If yes, when and what did the pharmacy advise?) Patient went through the automated system and was advised she had no refills.   Preferred Pharmacy (with phone number or street name):  [facility] [address], [place] - [address] Phone: [phone]  Fax: [phone]      Has the patient been seen for an appointment in the last year OR does the patient have an upcoming appointment? Yes.    Agent: Please be advised that RX refills may take up to 3 business days. We ask that you follow-up with your pharmacy.

## 2022-07-02 NOTE — Telephone Encounter (Signed)
  Chief Complaint: medication questions Symptoms: post hospital visit Frequency:   Pertinent Negatives: Patient denies   Disposition: [] ED /[] Urgent Care (no appt availability in office) / [] Appointment(In office/virtual)/ []  [place] [facility]/ [] Home Care/ [] Refused Recommended Disposition /[] [place] [facility]/ [x]  Follow-up with PCP Additional Notes: Patient has visit scheduled for Friday- has 2 medication questions for provider- see note . Patient has also requested RF on maintenance medications in another note.

## 2022-07-04 ENCOUNTER — Ambulatory Visit (INDEPENDENT_AMBULATORY_CARE_PROVIDER_SITE_OTHER): Payer: BC Managed Care – PPO | Admitting: Family Medicine

## 2022-07-04 ENCOUNTER — Encounter: Payer: Self-pay | Admitting: Family Medicine

## 2022-07-04 VITALS — BP 134/80 | HR 76 | Ht 61.0 in | Wt 158.8 lb

## 2022-07-04 DIAGNOSIS — E876 Hypokalemia: Secondary | ICD-10-CM

## 2022-07-04 DIAGNOSIS — E039 Hypothyroidism, unspecified: Secondary | ICD-10-CM

## 2022-07-04 DIAGNOSIS — R5383 Other fatigue: Secondary | ICD-10-CM | POA: Diagnosis not present

## 2022-07-04 DIAGNOSIS — I10 Essential (primary) hypertension: Secondary | ICD-10-CM

## 2022-07-04 NOTE — Progress Notes (Signed)
Subjective:    Patient ID: Morgan Wright, female    DOB: March 25, 1966, 56 y.o.   MRN: [id number]  Morgan Wright is a 56 y.o. female presenting on 07/04/2022 for Hospitalization Follow-up   HPI  Fatigue Hypokalemia Hyponatremia  She was seen on 06/23/22 by Denny Peon Mecum PA and had extensive lab testing including CBC CMET CRP COVID Flu Testing  Her K was 3.1 on initial testing 10/30, but patient was unaware. She called and was notified that labs were "normal".  She had persistent symptoms, but did not feel able to return to work and felt weak and fatigued. She went back to ED - [facility] 06/26/22 and lab testing done showed  Mag 2.2 normal  TSH 0.268 (previously higher 15 range) she was advised by ED to alternate Levothyroxine 50 and , instead of daily 75  Sodium low 129 and Potassium down to 2.9, Creatinine 1.10 and BUN 27  Started on Potassium treatment and IV Drip  4 hours later repeat lab BMET showed Sodium 131, K 3.0 improved, Creatinine 1.10  She felt better and was discharged from ED. Discharged on Potassium 40 meq for 5 days, and TSH adjustment.  She feels now more energy  Update, she was discontinued on PLavix and Started on Brilinta, continue ASA as well for 3 months, then continue Eliquis and Brilinta       07/04/2022    2:56 PM 03/26/2022    1:20 PM 11/05/2021    3:38 PM  Depression screen PHQ 2/9  Decreased Interest 0 0 0  Down, Depressed, Hopeless 0 0 0  PHQ - 2 Score 0 0 0  Altered sleeping 3 0 2  Tired, decreased energy 3 0 2  Change in appetite 0 0 3  Feeling bad or failure about yourself  0 0 0  Trouble concentrating 0 0 0  Moving slowly or fidgety/restless 2  0  Suicidal thoughts 0 0 0  PHQ-9 Score 8 0 7  Difficult doing work/chores Very difficult Not difficult at all Not difficult at all    Social History   Tobacco Use   Smoking status: Every Day    Packs/day: 0.75    Years: 40.00    Total pack years: 30.00    Types: Cigarettes    Smokeless tobacco: Never  Vaping Use   Vaping Use: Never used  Substance Use Topics   Alcohol use: No   Drug use: Never    Review of Systems Per HPI unless specifically indicated above     Objective:    BP 134/80 (BP Location: Left Arm, Cuff Size: Normal)   Pulse 76   Ht 5\' 1"  (1.549 m)   Wt 158 lb 12.8 oz (72 kg)   SpO2 97%   BMI 30.00 kg/m   Wt Readings from Last 3 Encounters:  07/04/22 158 lb 12.8 oz (72 kg)  06/23/22 163 lb (73.9 kg)  06/09/22 169 lb 3.2 oz (76.7 kg)    Physical Exam Vitals and nursing note reviewed.  Constitutional:      General: She is not in acute distress.    Appearance: Normal appearance. She is well-developed. She is not diaphoretic.     Comments: Well-appearing, comfortable, cooperative  HENT:     Head: Normocephalic and atraumatic.  Eyes:     General:        Right eye: No discharge.        Left eye: No discharge.     Conjunctiva/sclera: Conjunctivae normal.  Cardiovascular:     Rate and Rhythm: Normal rate.  Pulmonary:     Effort: Pulmonary effort is normal.  Skin:    General: Skin is warm and dry.     Findings: No erythema or rash.  Neurological:     Mental Status: She is alert and oriented to person, place, and time.  Psychiatric:        Mood and Affect: Mood normal.        Behavior: Behavior normal.        Thought Content: Thought content normal.     Comments: Well groomed, good eye contact, normal speech and thoughts       Results for orders placed or performed in visit on 06/23/22  SARS-CoV-2 RNA, Influenza A/B, and RSV RNA, Qualitative NAAT  Result Value Ref Range   INFLUENZA A RNA NOT DETECTED NOT DETECTED   INFLUENZA B RNA NOT DETECTED NOT DETECTED   RSV RNA NOT DETECTED NOT DETECTED   SARS COV2 RNA NOT DETECTED NOT DETECTED  TEST AUTHORIZATION  Result Value Ref Range   TEST NAME: COVID, FLU, RSV    TEST CODE: 39,816    CLIENT CONTACT: EMILY    REPORT ALWAYS MESSAGE SIGNATURE        Assessment & Plan:    Problem List Items Addressed This Visit     Acquired hypothyroidism - Primary   Relevant Medications   levothyroxine (SYNTHROID) 50 MCG tablet   Essential hypertension   Relevant Medications   olmesartan (BENICAR) 40 MG tablet   Fatigue   Other Visit Diagnoses     Hypokalemia           No orders of the defined types were placed in this encounter.  HFU - improved Dehydration Hypokalemia Weakness  Now improving after ED with IVF and potassium, med adjustment Suspect Thiazide chlorthalidone was worsening her weakness, dehydration hypoNa and K  Completed Potassium course 5 day  For BP meds  STOP Chlorthalidone 25mg  daily, I think this fluid pill caused the problem. It was apparently too strong, caused you to have low potassium and dehydration.  No more Potassium pills for now.  RESTART Olmesartan 40mg  daily - it can raise your potassium and help  Keep an eye on BP readings.  Keep on alternating Thyroid dose, 50 one day and 75 next day, let me know if need any refills  Check chemistry in 2 weeks, after med change, including Thyroid re-check  Orders Placed This Encounter  Procedures   BASIC METABOLIC PANEL WITH GFR    Standing Status:   Future    Standing Expiration Date:   10/24/2022   TSH    Standing Status:   Future    Standing Expiration Date:   10/24/2022   T4, free    Standing Status:   Future    Standing Expiration Date:   10/24/2022     Follow up plan: Return in about 2 weeks (around 07/18/2022) for 2 weeks fasting lab only (no apt after, just lab only).  12/24/2022, DO [facility] 07/04/2022, 3:02 PM

## 2022-07-04 NOTE — Telephone Encounter (Signed)
Caller wold like to know patient plan of care since being discharged from the hospital. Caller requesting a follow up call after patient appointment scheduled for today.

## 2022-07-04 NOTE — Patient Instructions (Addendum)
Thank you for coming to the office today.  Keep on alternating Thyroid dose, 50 one day and 75 next day, let me know if need any refills  For BP meds  STOP Chlorthalidone 25mg  daily, I think this fluid pill caused the problem. It was apparently too strong, caused you to have low potassium and dehydration.  No more Potassium pills for now.  RESTART Olmesartan 40mg  daily - it can raise your potassium and help  Keep an eye on BP readings.  DUE for FASTING BLOOD WORK (no food or drink after midnight before the lab appointment, only water or coffee without cream/sugar on the morning of)  SCHEDULE "Lab Only" visit in the morning at the clinic for lab draw in 2 WEEKS   - Make sure Lab Only appointment is at about 1 week before your next appointment, so that results will be available  For Lab Results, once available within 2-3 days of blood draw, you can can log in to MyChart online to view your results and a brief explanation. Also, we can discuss results at next follow-up visit.    Please schedule a Follow-up Appointment to: Return in about 2 weeks (around 07/18/2022) for 2 weeks fasting lab only (no apt after, just lab only).  If you have any other questions or concerns, please feel free to call the office or send a message through MyChart. You may also schedule an earlier appointment if necessary.  Additionally, you may be receiving a survey about your experience at our office within a few days to 1 week by e-mail or mail. We value your feedback.  , DO [facility], Saralyn Pilar

## 2022-07-07 ENCOUNTER — Ambulatory Visit: Payer: Self-pay | Admitting: *Deleted

## 2022-07-07 ENCOUNTER — Telehealth: Payer: Self-pay | Admitting: Family Medicine

## 2022-07-07 ENCOUNTER — Encounter: Payer: Self-pay | Admitting: Family Medicine

## 2022-07-07 ENCOUNTER — Other Ambulatory Visit: Payer: Self-pay | Admitting: Family Medicine

## 2022-07-07 DIAGNOSIS — E876 Hypokalemia: Secondary | ICD-10-CM

## 2022-07-07 DIAGNOSIS — I10 Essential (primary) hypertension: Secondary | ICD-10-CM

## 2022-07-07 DIAGNOSIS — E039 Hypothyroidism, unspecified: Secondary | ICD-10-CM

## 2022-07-07 MED ORDER — LEVOTHYROXINE SODIUM 50 MCG PO TABS: 50.0000 ug | ORAL_TABLET | ORAL | 3 refills | Status: DC

## 2022-07-07 MED ORDER — LEVOTHYROXINE SODIUM 75 MCG PO TABS: 75.0000 ug | ORAL_TABLET | ORAL | 3 refills | Status: DC

## 2022-07-07 MED ORDER — POTASSIUM CHLORIDE CRYS ER 10 MEQ PO TBCR: 10.0000 meq | EXTENDED_RELEASE_TABLET | Freq: Every day | ORAL | 0 refills | Status: DC

## 2022-07-07 NOTE — Telephone Encounter (Signed)
Medication Refill - Medication:  levothyroxine (SYNTHROID) 50 MCG tablet  levothyroxine (SYNTHROID) 75 MCG tablet   Has the patient contacted their pharmacy? No. (Agent: If no, request that the patient contact the pharmacy for the refill. If patient does not wish to contact the pharmacy document the reason why and proceed with request.) (Agent: If yes, when and what did the pharmacy advise?)  Preferred Pharmacy (with phone number or street name): levothyroxine (SYNTHROID) 75 MCG tablet  Has the patient been seen for an appointment in the last year OR does the patient have an upcoming appointment? Yes.    Agent: Please be advised that RX refills may take up to 3 business days. We ask that you follow-up with your pharmacy.

## 2022-07-07 NOTE — Telephone Encounter (Signed)
Please let her know  Okay, I will send potassium tab rx once daily. For 30 days we can continue if it is helping, she can let me know.  She should be aware as we discussed but can remind her again, my hope is that her body will improve with time. The medication I believe that caused the problem was just recently discontinued (Chlorthalidone) so hopefully it improves in time.  Saralyn Pilar, DO [facility] 07/07/2022, 11:37 AM

## 2022-07-07 NOTE — Telephone Encounter (Signed)
Copied from CRM [id number]. Topic: General - Other >> Jul 07, 2022  1:11 PM OVANVBTY J wrote: Reason for CRM: pt is requesting a Dr. note, excusing her from work for today, if so pt would like to have note placed in mychart.   ALSO, pt would like to know if provider would complete her FMLA paperwork for her? Pt says that she had surgery with [facility] and is getting the run around about who will complete?   CB: [id number] or [phone]- please assist pt further.

## 2022-07-07 NOTE — Telephone Encounter (Signed)
  Per agent: "Patient was seen in ED on 11/2 for fatigue and potassium. Patient states the potassium was helping while she was on it, but now that she has been off of it for a few days she feels fatigued again and was not able to get out of bed to go to work this morning. She saw PCP on Friday and was not given an rx for potassium but wants to know if that is something she can continue taking. Please advise."     Chief Complaint: Fatigue Symptoms: Seen on OV Friday. States fatigue returned SAturday. States purchased OTC K+ to take, has taken over W/E ,1/day. Noted Dr. Althea Charon OV note states to stop K+. States has stopped the Chlorthalidone.  Reports too fatigued to work today, "Can do very little."  Frequency: Saturday Pertinent Negatives: Patient denies  Disposition: [] ED /[] Urgent Care (no appt availability in office) / [] Appointment(In office/virtual)/ []  [place] Virtual Care/ [] Home Care/ [] Refused Recommended Disposition /[] [place] Mobile [facility]/ [x]  Follow-up with PCP Additional Notes: Assured pt NT would route to PCP for review and final disposition. Please advise   Reason for Disposition  [1] Fatigue (i.e., tires easily, decreased energy) AND [2] persists > 1 week  Answer Assessment - Initial Assessment Questions 1. DESCRIPTION: "Describe how you are feeling."     Fatigued 2. SEVERITY: "How bad is it?"  "Can you stand and walk?"   - MILD (0-3): Feels weak or tired, but does not interfere with work, school or normal activities.   - MODERATE (4-7): Able to stand and walk; weakness interferes with work, school, or normal activities.   - SEVERE (8-10): Unable to stand or walk; unable to do usual activities.     Moderate, stayed out of work today 3. ONSET: "When did these symptoms begin?" (e.g., hours, days, weeks, months)     Saturday 4. CAUSE: "What do you think is causing the weakness or fatigue?" (e.g., not drinking enough fluids, medical problem, trouble sleeping)     Not  sure 5. NEW MEDICINES:  "Have you started on any new medicines recently?" (e.g., opioid pain medicines, benzodiazepines, muscle relaxants, antidepressants, antihistamines, neuroleptics, beta blockers)     no 6. OTHER SYMPTOMS: "Do you have any other symptoms?" (e.g., chest pain, fever, cough, SOB, vomiting, diarrhea, bleeding, other areas of pain)     "Keep diarrhea x 6 months" staying hydrating.  Protocols used: Weakness (Generalized) and Fatigue-A-AH

## 2022-07-07 NOTE — Telephone Encounter (Signed)
Copied from CRM [id number]. Topic: General - Other >> Jul 07, 2022  1:11 PM OZDGUYQI J wrote: Reason for CRM: pt is requesting a Dr. note, excusing her from work for today, if so pt would like to have note placed in mychart.    ALSO, pt would like to know if provider would complete her FMLA paperwork for her? Pt says that she had surgery with [facility] and is getting the run around about who will complete?    CB: [id number] or [phone]- please assist pt further.  -------------  I will write the doctors note for work today Monday 07/07/22 and send it to her mychart.  She should return on Tuesday 07/08/22.  Regarding FMLA, unfortunately if the time she missed was due to hospitalization for vascular surgery for stent placement, I am not 100% sure that I can get her FMLA approved.  I would need to know more information specifically on the exact dates she missed due to the surgery, and what they told her why they cannot fill out her FMLA.  I see on the mychart they gave her instructions and a fax # to use to submit the FMLA Paperwork so that Vascular Surgery can complete the FMLA.  I don't see any reason that they cannot do the FMLA since it will require surgical information in the paperwork.  Saralyn Pilar, DO [facility] 07/07/2022, 6:02 PM

## 2022-07-10 NOTE — Telephone Encounter (Signed)
Message left for pt

## 2022-08-06 ENCOUNTER — Other Ambulatory Visit: Payer: BC Managed Care – PPO

## 2022-08-06 ENCOUNTER — Other Ambulatory Visit: Payer: Self-pay

## 2022-08-06 DIAGNOSIS — E039 Hypothyroidism, unspecified: Secondary | ICD-10-CM | POA: Diagnosis not present

## 2022-08-07 LAB — T4, FREE: Free T4: 1.3 ng/dL (ref 0.8–1.8)

## 2022-08-07 LAB — TSH: TSH: 2.68 mIU/L (ref 0.40–4.50)

## 2022-09-26 ENCOUNTER — Ambulatory Visit (INDEPENDENT_AMBULATORY_CARE_PROVIDER_SITE_OTHER): Payer: BC Managed Care – PPO | Admitting: Family Medicine

## 2022-09-26 ENCOUNTER — Other Ambulatory Visit: Payer: Self-pay | Admitting: Family Medicine

## 2022-09-26 ENCOUNTER — Encounter: Payer: Self-pay | Admitting: Family Medicine

## 2022-09-26 VITALS — BP 160/80 | HR 70 | Ht 61.0 in | Wt 166.0 lb

## 2022-09-26 DIAGNOSIS — I2510 Atherosclerotic heart disease of native coronary artery without angina pectoris: Secondary | ICD-10-CM

## 2022-09-26 DIAGNOSIS — I701 Atherosclerosis of renal artery: Secondary | ICD-10-CM | POA: Diagnosis not present

## 2022-09-26 DIAGNOSIS — I10 Essential (primary) hypertension: Secondary | ICD-10-CM

## 2022-09-26 DIAGNOSIS — E039 Hypothyroidism, unspecified: Secondary | ICD-10-CM

## 2022-09-26 DIAGNOSIS — Z0000 Encounter for general adult medical examination without abnormal findings: Secondary | ICD-10-CM

## 2022-09-26 DIAGNOSIS — E1169 Type 2 diabetes mellitus with other specified complication: Secondary | ICD-10-CM

## 2022-09-26 LAB — POCT GLYCOSYLATED HEMOGLOBIN (HGB A1C): Hemoglobin A1C: 5.6 % (ref 4.0–5.6)

## 2022-09-26 NOTE — Progress Notes (Signed)
Subjective:    Patient ID: Morgan Wright, female    DOB: 11-23-1965, 57 y.o.   MRN: [id number]  Morgan Wright is a 57 y.o. female presenting on 09/26/2022 for Diabetes and Hypertension   HPI  Type 2 Diabetes Diagnosed 2020, has resumed medications Last A1c 6.7 (05/27/22) Due today for A1c CBGs:Controlled  Meds: Metformin XR 750 x 2 = 1500 daily with supper Currently on ARB Lifestyle: - Diet (not following DM diet - she has tried to improve but not always following a specific diet,  - Exercise (limited currently) - Goal to see Eye Doctor for diabetic exam Denies hypoglycemia, polyuria, visual changes, numbness or tingling.   CHRONIC HTN:  Elevated BP in office, not checking regularly at home. Current Meds - Losartan 50mg  daily, Metoprolol XL succinate 50mg  daily Out of medicines. Denies CP, dyspnea, HA, edema, dizziness / lightheadedness   Hypothyroidism Prior history of low thyroid hormone Previously on levothyroxine 66mcg daily, needs re order   HYPERLIPIDEMIA: Known CAD and history of HLD Controlled on Rosuvastatin 40mg  daily   CAD s/p CABG x 3 2010 Previously identified new systolic murmur also with carotid bruits Previously ordered ECHO and Cardiology referral were not completed. Cancelled/no show  She is trying to connect with mail order to get her brand name meds cheaper [place] or [facility]  Completed Potassium for 1 week, then not resumed. No longer needing after medication change        07/04/2022    2:56 PM 03/26/2022    1:20 PM 11/05/2021    3:38 PM  Depression screen PHQ 2/9  Decreased Interest 0 0 0  Down, Depressed, Hopeless 0 0 0  PHQ - 2 Score 0 0 0  Altered sleeping 3 0 2  Tired, decreased energy 3 0 2  Change in appetite 0 0 3  Feeling bad or failure about yourself  0 0 0  Trouble concentrating 0 0 0  Moving slowly or fidgety/restless 2  0  Suicidal thoughts 0 0 0  PHQ-9 Score 8 0 7  Difficult doing work/chores  Very difficult Not difficult at all Not difficult at all    Social History   Tobacco Use   Smoking status: Every Day    Packs/day: 0.75    Years: 40.00    Total pack years: 30.00    Types: Cigarettes   Smokeless tobacco: Never  Vaping Use   Vaping Use: Never used  Substance Use Topics   Alcohol use: No   Drug use: Never    Review of Systems Per HPI unless specifically indicated above     Objective:    BP (!) 160/80 (BP Location: Left Arm, Cuff Size: Normal)   Pulse 70   Ht 5\' 1"  (1.549 m)   Wt 166 lb (75.3 kg)   SpO2 99%   BMI 31.37 kg/m   Wt Readings from Last 3 Encounters:  09/26/22 166 lb (75.3 kg)  07/04/22 158 lb 12.8 oz (72 kg)  06/23/22 163 lb (73.9 kg)    Physical Exam Vitals and nursing note reviewed.  Constitutional:      General: She is not in acute distress.    Appearance: She is well-developed. She is not diaphoretic.     Comments: Well-appearing, comfortable, cooperative  HENT:     Head: Normocephalic and atraumatic.  Eyes:     General:        Right eye: No discharge.        Left  eye: No discharge.     Conjunctiva/sclera: Conjunctivae normal.     Pupils: Pupils are equal, round, and reactive to light.  Neck:     Thyroid: No thyromegaly.     Vascular: No carotid bruit.  Cardiovascular:     Rate and Rhythm: Normal rate and regular rhythm.     Pulses: Normal pulses.     Heart sounds: Murmur (2 / 6 systolic) heard.  Pulmonary:     Effort: Pulmonary effort is normal. No respiratory distress.     Breath sounds: Normal breath sounds. No wheezing or rales.  Abdominal:     General: Bowel sounds are normal. There is no distension.     Palpations: Abdomen is soft. There is no mass.     Tenderness: There is no abdominal tenderness.  Musculoskeletal:        General: No tenderness. Normal range of motion.     Cervical back: Normal range of motion and neck supple.     Right lower leg: No edema.     Left lower leg: No edema.     Comments: Upper /  Lower Extremities: - Normal muscle tone, strength bilateral upper extremities 5/5, lower extremities 5/5  Lymphadenopathy:     Cervical: No cervical adenopathy.  Skin:    General: Skin is warm and dry.     Findings: No erythema or rash.  Neurological:     Mental Status: She is alert and oriented to person, place, and time.     Comments: Distal sensation intact to light touch all extremities  Psychiatric:        Mood and Affect: Mood normal.        Behavior: Behavior normal.        Thought Content: Thought content normal.     Comments: Well groomed, good eye contact, normal speech and thoughts     Results for orders placed or performed in visit on 09/26/22  POCT HgB A1C  Result Value Ref Range   Hemoglobin A1C 5.6 4.0 - 5.6 %   Recent Labs    02/05/22 0807 05/27/22 0000 09/26/22 1448  HGBA1C 6.6* 6.7 5.6        Assessment & Plan:   Problem List Items Addressed This Visit     Atherosclerotic renal artery stenosis (HCC)   Essential hypertension   Morbid obesity ([place])   Type 2 diabetes mellitus with other specified complication ([place]) - Primary   Relevant Orders   POCT HgB A1C (Completed)   #T2DM A1c 5.6 significant improvement. Currently improved Continue current therapy  #HYPERTENSION Elevated repeat reading today since patient did not take med Goal to improve adherence, resume therapy Monitor BP outside of office Notify if still elevated  OFF Potassium now doing well.   Murmur Again offer Future ECHO / Cardiology referral - previously these were no show / cancelled after ordred at last visit.  She will update her mail order pharmacy to get her meds covered  Health Maintenance screening updates  Recommend Colonoscopy - she will call for scheduling Notify us if need referral  Mammogram ordered She will call to schedule  Offered LDCT She declines now but reconsider  No orders of the defined types were placed in this encounter.   Follow up  plan: Return in about 6 months (around 03/27/2023) for 6 month fasting lab only then 1 week later Annual Physical.  Future labs ordered for 03/2023  Nobie Putnam, [facility] 09/26/2022, 2:46 PM

## 2022-09-26 NOTE — Patient Instructions (Addendum)
Thank you for coming to the office today.  Check with [employer] on which mail order pharmacy - [facility] OR [facility] - common options can hopefully get the brand name meds covered.  Recommend Colonoscopy  Let me know if you need a referral.  [facility] [place], [place] [address] Phone: [phone]  [place], [place] [address] Hours: 8AM-5PM Phone: [phone]  -------  For Mammogram screening for breast cancer   Call the [facility] below anytime to schedule your own appointment now that order has been placed.  [place] [address] [place] [address] [place], [place] [address] Phone: [phone]  ----  Future Lung CA Screening CT if interested let me know.  DUE for FASTING BLOOD WORK (no food or drink after midnight before the lab appointment, only water or coffee without cream/sugar on the morning of)  SCHEDULE "Lab Only" visit in the morning at the clinic for lab draw in 6 MONTHS   - Make sure Lab Only appointment is at about 1 week before your next appointment, so that results will be available  For Lab Results, once available within 2-3 days of blood draw, you can can log in to MyChart online to view your results and a brief explanation. Also, we can discuss results at next follow-up visit.   Please schedule a Follow-up Appointment to: Return in about 6 months (around 03/27/2023) for 6 month fasting lab only then 1 week later Annual Physical.  If you have any other questions or concerns, please feel free to call the office or send a message through [facility]. You may also schedule an earlier appointment if necessary.  Additionally, you may be receiving a survey about your experience at our office within a few days to 1 week by e-mail or mail. We value your feedback.  Nobie Putnam,  DO [facility]

## 2022-09-30 ENCOUNTER — Telehealth: Payer: Self-pay | Admitting: Family Medicine

## 2022-09-30 DIAGNOSIS — I2510 Atherosclerotic heart disease of native coronary artery without angina pectoris: Secondary | ICD-10-CM

## 2022-09-30 MED ORDER — BRILINTA 90 MG PO TABS: 90.0000 mg | ORAL_TABLET | Freq: Two times a day (BID) | ORAL | 3 refills | Status: DC

## 2022-09-30 NOTE — Telephone Encounter (Signed)
The patient called in stating her insurance will not cover a 30 day supply of BRILINTA 90 MG TABS tablet they will only cover a 90 day supply. She is requesting the provider to write a prescription for the 90 day supply to send into her pharmacy   [place], [place] Phone: [phone]  Fax: [phone]     Please assist patient further

## 2022-09-30 NOTE — Telephone Encounter (Signed)
Ordered  Nobie Putnam, DO [facility] 09/30/2022, 1:17 PM

## 2022-10-02 ENCOUNTER — Other Ambulatory Visit: Payer: Self-pay | Admitting: Family Medicine

## 2022-10-02 ENCOUNTER — Telehealth: Payer: Self-pay | Admitting: Family Medicine

## 2022-10-02 DIAGNOSIS — I2510 Atherosclerotic heart disease of native coronary artery without angina pectoris: Secondary | ICD-10-CM

## 2022-10-02 MED ORDER — ELIQUIS 5 MG PO TABS: 5.0000 mg | ORAL_TABLET | Freq: Two times a day (BID) | ORAL | 3 refills | Status: DC

## 2022-10-02 MED ORDER — ASPIRIN 81 MG PO TBEC: 81.0000 mg | DELAYED_RELEASE_TABLET | Freq: Every day | ORAL | 12 refills | Status: AC

## 2022-10-02 NOTE — Telephone Encounter (Signed)
[employer] wont cover pts Elliquis unless it is sent in as 11 day supply / please resnd in new RX for 90 day supplies to [facility]

## 2022-10-02 NOTE — Telephone Encounter (Signed)
Called the [facility], clarified the medication situation. Per Vascular Surgery, patient is to take both Brilinta and Eliquis.  Nobie Putnam, [facility] 10/02/2022, 2:40 PM

## 2022-10-02 NOTE — Telephone Encounter (Signed)
Pilar Plate from the Pharmacy is calling to clarify if pt is still taking both medications and is needing to check the dosage for both medications.  apixaban (ELIQUIS) 5 MG TABS tablet  BRILINTA 90 MG TABS tablet     Please advise

## 2022-10-13 ENCOUNTER — Other Ambulatory Visit: Payer: Self-pay | Admitting: Family Medicine

## 2022-10-13 DIAGNOSIS — I10 Essential (primary) hypertension: Secondary | ICD-10-CM

## 2022-10-13 MED ORDER — CARVEDILOL 25 MG PO TABS: 25.0000 mg | ORAL_TABLET | Freq: Two times a day (BID) | ORAL | 3 refills | Status: DC

## 2022-10-14 ENCOUNTER — Other Ambulatory Visit: Payer: Self-pay | Admitting: Family Medicine

## 2022-10-14 DIAGNOSIS — I2510 Atherosclerotic heart disease of native coronary artery without angina pectoris: Secondary | ICD-10-CM

## 2022-10-14 DIAGNOSIS — I10 Essential (primary) hypertension: Secondary | ICD-10-CM

## 2022-10-14 NOTE — Telephone Encounter (Signed)
PT called reporting that insurance will only cover a 90 day supply of these medications  Medication Refill - Medication:   carvedilol (COREG) 25 MG tablet  ELIQUIS 5 MG TABS tablet   Has the patient contacted their pharmacy? Yes.   (Agent: If no, request that the patient contact the pharmacy for the refill. If patient does not wish to contact the pharmacy document the reason why and proceed with request.) (Agent: If yes, when and what did the pharmacy advise?)  Preferred Pharmacy (with phone number or street name):  [place], [place] [address] [place] [address]  Phone: [phone] Fax: [phone]   Has the patient been seen for an appointment in the last year OR does the patient have an upcoming appointment? Yes.    Agent: Please be advised that RX refills may take up to 3 business days. We ask that you follow-up with your pharmacy.

## 2022-10-15 NOTE — Telephone Encounter (Signed)
Unable to refill per protocol, Rx request is too soon. Last refill 10/13/22 for 90 days and 3 refills.  Requested Prescriptions  Pending Prescriptions Disp Refills   carvedilol (COREG) 25 MG tablet 90 tablet 3    Sig: Take 1 tablet (25 mg total) by mouth 2 (two) times daily.     Cardiovascular: Beta Blockers 3 Failed - 10/14/2022  4:28 PM      Failed - Cr in normal range and within 360 days    Creat  Date Value Ref Range Status  06/23/2022 1.05 (H) 0.50 - 1.03 mg/dL Final   Creatinine, Urine  Date Value Ref Range Status  03/26/2022 78 20 - 275 mg/dL Final         Failed - ALT in normal range and within 360 days    ALT  Date Value Ref Range Status  06/23/2022 37 (H) 6 - 29 U/L Final   SGPT (ALT)  Date Value Ref Range Status  06/15/2012 25 12 - 78 U/L Final         Failed - Last BP in normal range    BP Readings from Last 1 Encounters:  09/26/22 (!) 160/80         Passed - AST in normal range and within 360 days    AST  Date Value Ref Range Status  06/23/2022 33 10 - 35 U/L Final   SGOT(AST)  Date Value Ref Range Status  06/15/2012 14 (L) 15 - 37 Unit/L Final         Passed - Last Heart Rate in normal range    Pulse Readings from Last 1 Encounters:  09/26/22 70         Passed - Valid encounter within last 6 months    Recent Outpatient Visits           2 weeks ago Type 2 diabetes mellitus with other specified complication, without long-term current use of insulin [facility])   [place], Devonne Doughty, DO   3 months ago Acquired hypothyroidism   [place] J, DO   3 months ago Fatigue, unspecified type   [facility] Mecum, [place] E, [place]   4 months ago Atherosclerotic renal artery stenosis [facility])   [place], DO   6 months ago Annual physical exam   [place] [facility], Alexander J, DO               ELIQUIS 5 MG TABS tablet 180 tablet 3    Sig: Take 1 tablet (5 mg total) by mouth 2 (two) times daily.     Hematology:  Anticoagulants - apixaban Failed - 10/14/2022  4:28 PM      Failed - Cr in normal range and within 360 days    Creat  Date Value Ref Range Status  06/23/2022 1.05 (H) 0.50 - 1.03 mg/dL Final   Creatinine, Urine  Date Value Ref Range Status  03/26/2022 78 20 - 275 mg/dL Final         Failed - ALT in normal range and within 360 days    ALT  Date Value Ref Range Status  06/23/2022 37 (H) 6 - 29 U/L Final   SGPT (ALT)  Date Value Ref Range Status  06/15/2012 25 12 - 78 U/L Final         Passed - PLT in  normal range and within 360 days    Platelets  Date Value Ref Range Status  06/23/2022 177 140 - 400 Thousand/uL Final   Platelet  Date Value Ref Range Status  06/15/2012 295 150 - 440 x10 3/mm 3 Final         Passed - HGB in normal range and within 360 days    Hemoglobin  Date Value Ref Range Status  06/23/2022 13.4 11.7 - 15.5 g/dL Final   HGB  Date Value Ref Range Status  06/15/2012 16.0 12.0 - 16.0 g/dL Final         Passed - HCT in normal range and within 360 days    HCT  Date Value Ref Range Status  06/23/2022 39.6 35.0 - 45.0 % Final  06/15/2012 46.6 35.0 - 47.0 % Final         Passed - AST in normal range and within 360 days    AST  Date Value Ref Range Status  06/23/2022 33 10 - 35 U/L Final   SGOT(AST)  Date Value Ref Range Status  06/15/2012 14 (L) 15 - 77 Unit/L Final         Passed - Valid encounter within last 12 months    Recent Outpatient Visits           2 weeks ago Type 2 diabetes mellitus with other specified complication, without long-term current use of insulin [facility])   [place], Devonne Doughty, DO   3 months ago Acquired hypothyroidism   [place] J, DO   3 months ago  Fatigue, unspecified type   [place], [place]   4 months ago Atherosclerotic renal artery stenosis [facility])   [place], DO   6 months ago Annual physical exam   [place], Devonne Doughty, [place]

## 2022-10-15 NOTE — Telephone Encounter (Signed)
Requesting PCP send over a new script for carvedilol (COREG) 25 MG tablet to reflect 90 supply 2x daily. Pharmacy states the initial script is for a 45 day supply 2x daily. Insurance will not cover anything less then 90 days.    [place], [place] Phone: [phone]  Fax: [phone]

## 2022-10-16 ENCOUNTER — Other Ambulatory Visit: Payer: Self-pay | Admitting: Family Medicine

## 2022-10-16 DIAGNOSIS — E1169 Type 2 diabetes mellitus with other specified complication: Secondary | ICD-10-CM

## 2022-10-16 MED ORDER — METFORMIN HCL ER 750 MG PO TB24: 1500.0000 mg | ORAL_TABLET | Freq: Every day | ORAL | 3 refills | Status: DC

## 2022-10-20 MED ORDER — CARVEDILOL 25 MG PO TABS: 25.0000 mg | ORAL_TABLET | Freq: Two times a day (BID) | ORAL | 3 refills | Status: DC

## 2022-10-20 MED ORDER — ELIQUIS 5 MG PO TABS: 5.0000 mg | ORAL_TABLET | Freq: Two times a day (BID) | ORAL | 3 refills | Status: DC

## 2022-10-20 NOTE — Telephone Encounter (Signed)
Patient states that her insurance will not cover anything under a 90 day supply. Patient needs carvedilol (COREG) 25 MG tablet to reflect 90 supply 2x daily sent to  [place], [place] Phone: [phone]  Fax: [phone]

## 2022-10-20 NOTE — Addendum Note (Signed)
Addended by: Erie Noe on: 10/20/2022 02:32 PM   Modules accepted: Orders

## 2022-10-20 NOTE — Telephone Encounter (Signed)
Resent rxs as 90 DS d/t previous was for 45 DS.   Requested Prescriptions  Pending Prescriptions Disp Refills   carvedilol (COREG) 25 MG tablet 180 tablet 3    Sig: Take 1 tablet (25 mg total) by mouth 2 (two) times daily.     Cardiovascular: Beta Blockers 3 Failed - 10/20/2022  2:32 PM      Failed - Cr in normal range and within 360 days    Creat  Date Value Ref Range Status  06/23/2022 1.05 (H) 0.50 - 1.03 mg/dL Final   Creatinine, Urine  Date Value Ref Range Status  03/26/2022 78 20 - 275 mg/dL Final         Failed - ALT in normal range and within 360 days    ALT  Date Value Ref Range Status  06/23/2022 37 (H) 6 - 29 U/L Final   SGPT (ALT)  Date Value Ref Range Status  06/15/2012 25 12 - 78 U/L Final         Failed - Last BP in normal range    BP Readings from Last 1 Encounters:  09/26/22 (!) 160/80         Passed - AST in normal range and within 360 days    AST  Date Value Ref Range Status  06/23/2022 33 10 - 35 U/L Final   SGOT(AST)  Date Value Ref Range Status  06/15/2012 14 (L) 15 - 37 Unit/L Final         Passed - Last Heart Rate in normal range    Pulse Readings from Last 1 Encounters:  09/26/22 70         Passed - Valid encounter within last 6 months    Recent Outpatient Visits           3 weeks ago Type 2 diabetes mellitus with other specified complication, without long-term current use of insulin [facility])   [place], Devonne Doughty, DO   3 months ago Acquired hypothyroidism   [place], Devonne Doughty, DO   3 months ago Fatigue, unspecified type   [facility] Mecum, [place] E, [place]   4 months ago Atherosclerotic renal artery stenosis [facility])   [place], DO   6 months ago Annual physical exam   [facility] Karamalegos, Alexander J, DO               ELIQUIS 5  MG TABS tablet 180 tablet 3    Sig: Take 1 tablet (5 mg total) by mouth 2 (two) times daily.     Hematology:  Anticoagulants - apixaban Failed - 10/20/2022  2:32 PM      Failed - Cr in normal range and within 360 days    Creat  Date Value Ref Range Status  06/23/2022 1.05 (H) 0.50 - 1.03 mg/dL Final   Creatinine, Urine  Date Value Ref Range Status  03/26/2022 78 20 - 275 mg/dL Final         Failed - ALT in normal range and within 360 days    ALT  Date Value Ref Range Status  06/23/2022 37 (H) 6 - 29 U/L Final   SGPT (ALT)  Date Value Ref Range Status  06/15/2012 25 12 - 78 U/L Final         Passed - PLT in normal range and within 360 days  Platelets  Date Value Ref Range Status  06/23/2022 177 140 - 400 Thousand/uL Final   Platelet  Date Value Ref Range Status  06/15/2012 295 150 - 440 x10 3/mm 3 Final         Passed - HGB in normal range and within 360 days    Hemoglobin  Date Value Ref Range Status  06/23/2022 13.4 11.7 - 15.5 g/dL Final   HGB  Date Value Ref Range Status  06/15/2012 16.0 12.0 - 16.0 g/dL Final         Passed - HCT in normal range and within 360 days    HCT  Date Value Ref Range Status  06/23/2022 39.6 35.0 - 45.0 % Final  06/15/2012 46.6 35.0 - 47.0 % Final         Passed - AST in normal range and within 360 days    AST  Date Value Ref Range Status  06/23/2022 33 10 - 35 U/L Final   SGOT(AST)  Date Value Ref Range Status  06/15/2012 14 (L) 15 - 19 Unit/L Final         Passed - Valid encounter within last 12 months    Recent Outpatient Visits           3 weeks ago Type 2 diabetes mellitus with other specified complication, without long-term current use of insulin [facility])   [place], Devonne Doughty, DO   3 months ago Acquired hypothyroidism   [place] J, DO   3 months ago Fatigue, unspecified type   [place] Mecum, [place] E, [place]   4 months ago Atherosclerotic renal artery stenosis [facility])   [place], DO   6 months ago Annual physical exam   [facility] [place], Devonne Doughty, DO              Refused Prescriptions Disp Refills   carvedilol (COREG) 25 MG tablet 90 tablet 3    Sig: Take 1 tablet (25 mg total) by mouth 2 (two) times daily.     Cardiovascular: Beta Blockers 3 Failed - 10/20/2022  2:32 PM      Failed - Cr in normal range and within 360 days    Creat  Date Value Ref Range Status  06/23/2022 1.05 (H) 0.50 - 1.03 mg/dL Final   Creatinine, Urine  Date Value Ref Range Status  03/26/2022 78 20 - 275 mg/dL Final         Failed - ALT in normal range and within 360 days    ALT  Date Value Ref Range Status  06/23/2022 37 (H) 6 - 29 U/L Final   SGPT (ALT)  Date Value Ref Range Status  06/15/2012 25 12 - 78 U/L Final         Failed - Last BP in normal range    BP Readings from Last 1 Encounters:  09/26/22 (!) 160/80         Passed - AST in normal range and within 360 days    AST  Date Value Ref Range Status  06/23/2022 33 10 - 35 U/L Final   SGOT(AST)  Date Value Ref Range Status  06/15/2012 14 (L) 15 - 37 Unit/L Final         Passed - Last Heart Rate in normal range    Pulse Readings from Last 1 Encounters:  09/26/22 70         Passed - Valid encounter within last 6 months    Recent Outpatient Visits           3 weeks ago Type 2 diabetes mellitus with other specified complication, without long-term current use of insulin [facility])   [place], DO   3 months ago Acquired hypothyroidism   [place], DO   3 months ago Fatigue, unspecified type   [facility] Mecum, [place], [place]   4 months ago Atherosclerotic renal artery stenosis [facility])    [facility] Olin Hauser, DO   6 months ago Annual physical exam   [facility] Karamalegos, Alexander J, DO               ELIQUIS 5 MG TABS tablet 180 tablet 3    Sig: Take 1 tablet (5 mg total) by mouth 2 (two) times daily.     Hematology:  Anticoagulants - apixaban Failed - 10/20/2022  2:32 PM      Failed - Cr in normal range and within 360 days    Creat  Date Value Ref Range Status  06/23/2022 1.05 (H) 0.50 - 1.03 mg/dL Final   Creatinine, Urine  Date Value Ref Range Status  03/26/2022 78 20 - 275 mg/dL Final         Failed - ALT in normal range and within 360 days    ALT  Date Value Ref Range Status  06/23/2022 37 (H) 6 - 29 U/L Final   SGPT (ALT)  Date Value Ref Range Status  06/15/2012 25 12 - 78 U/L Final         Passed - PLT in normal range and within 360 days    Platelets  Date Value Ref Range Status  06/23/2022 177 140 - 400 Thousand/uL Final   Platelet  Date Value Ref Range Status  06/15/2012 295 150 - 440 x10 3/mm 3 Final         Passed - HGB in normal range and within 360 days    Hemoglobin  Date Value Ref Range Status  06/23/2022 13.4 11.7 - 15.5 g/dL Final   HGB  Date Value Ref Range Status  06/15/2012 16.0 12.0 - 16.0 g/dL Final         Passed - HCT in normal range and within 360 days    HCT  Date Value Ref Range Status  06/23/2022 39.6 35.0 - 45.0 % Final  06/15/2012 46.6 35.0 - 47.0 % Final         Passed - AST in normal range and within 360 days    AST  Date Value Ref Range Status  06/23/2022 33 10 - 35 U/L Final   SGOT(AST)  Date Value Ref Range Status  06/15/2012 14 (L) 15 - 70 Unit/L Final         Passed - Valid encounter within last 12 months    Recent Outpatient Visits           3 weeks ago Type 2 diabetes mellitus with other specified complication, without long-term current use of insulin [facility])   [place],  Devonne Doughty, DO   3 months ago Acquired hypothyroidism   [place], Devonne Doughty, DO   3 months ago Fatigue,  unspecified type   [facility] Mecum, [place] E, [place]   4 months ago Atherosclerotic renal artery stenosis [facility]   [place], DO   6 months ago Annual physical exam   [facility] [place], Devonne Doughty, [place]

## 2023-03-21 ENCOUNTER — Other Ambulatory Visit: Payer: Self-pay | Admitting: Family Medicine

## 2023-03-23 NOTE — Telephone Encounter (Signed)
Requested medication (s) are due for refill today:   Provider to review  Requested medication (s) are on the active medication list:   Yes as historical  Future visit scheduled:   No   Last ordered: Listed as historical with no information.  Returned because it's historical   Requested Prescriptions  Pending Prescriptions Disp Refills   olmesartan (BENICAR) 40 MG tablet [Pharmacy Med Name: Olmesartan Medoxomil 40 MG Oral Tablet] 90 tablet 0    Sig: TAKE 1 TABLET BY MOUTH ONCE DAILY -  DISCONTINUE  LOSARTAN  50  MG     Cardiovascular:  Angiotensin Receptor Blockers Failed - 03/21/2023  6:51 AM      Failed - Cr in normal range and within 180 days    Creat  Date Value Ref Range Status  06/23/2022 1.05 (H) 0.50 - 1.03 mg/dL Final   Creatinine, Urine  Date Value Ref Range Status  03/26/2022 78 20 - 275 mg/dL Final         Failed - K in normal range and within 180 days    Potassium  Date Value Ref Range Status  06/23/2022 3.1 (L) 3.5 - 5.3 mmol/L Final  06/15/2012 4.3 3.5 - 5.1 mmol/L Final         Failed - Last BP in normal range    BP Readings from Last 1 Encounters:  09/26/22 (!) 160/80         Passed - Patient is not pregnant      Passed - Valid encounter within last 6 months    Recent Outpatient Visits           5 months ago Type 2 diabetes mellitus with other specified complication, without long-term current use of insulin [facility])   [place] [facility] Smitty Cords, DO   8 months ago Acquired hypothyroidism   [facility] Smitty [facility]ords, DO   9 months ago Fatigue, unspecified type   [facility] Mecum, [place] E, [place]   9 months ago Atherosclerotic renal artery stenosis [facility])   [place] [facility] Smitty Cords, DO   12 months ago Annual physical exam   [place] [facility] [place], Netta Neat, [place]

## 2023-05-01 ENCOUNTER — Other Ambulatory Visit: Payer: Self-pay | Admitting: Family Medicine

## 2023-05-01 DIAGNOSIS — E782 Mixed hyperlipidemia: Secondary | ICD-10-CM

## 2023-05-05 IMAGING — CR DG CHEST 2V
2 series · 2 of 2 positions shown · non-contrast
Comparison: None.

CLINICAL DATA: Cough, fever.

EXAM:
CHEST - 2 VIEW

[chest pa]
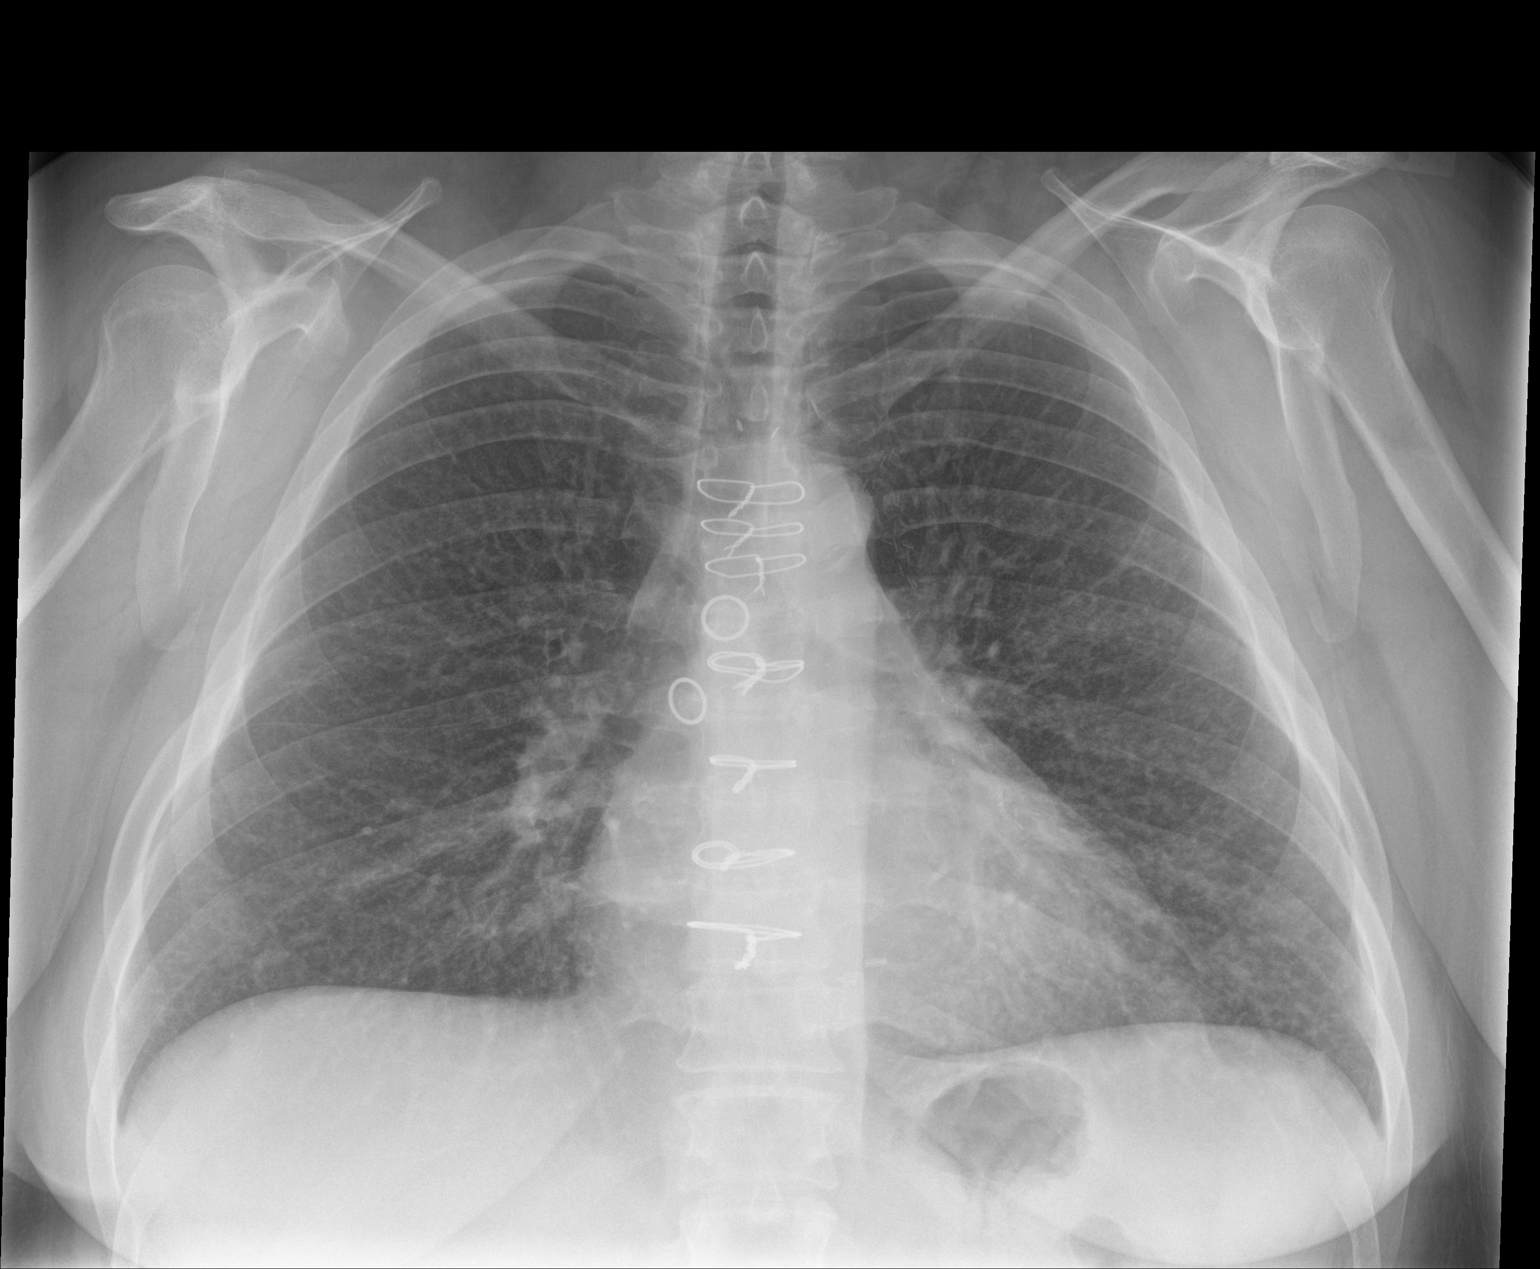

[chest lat]
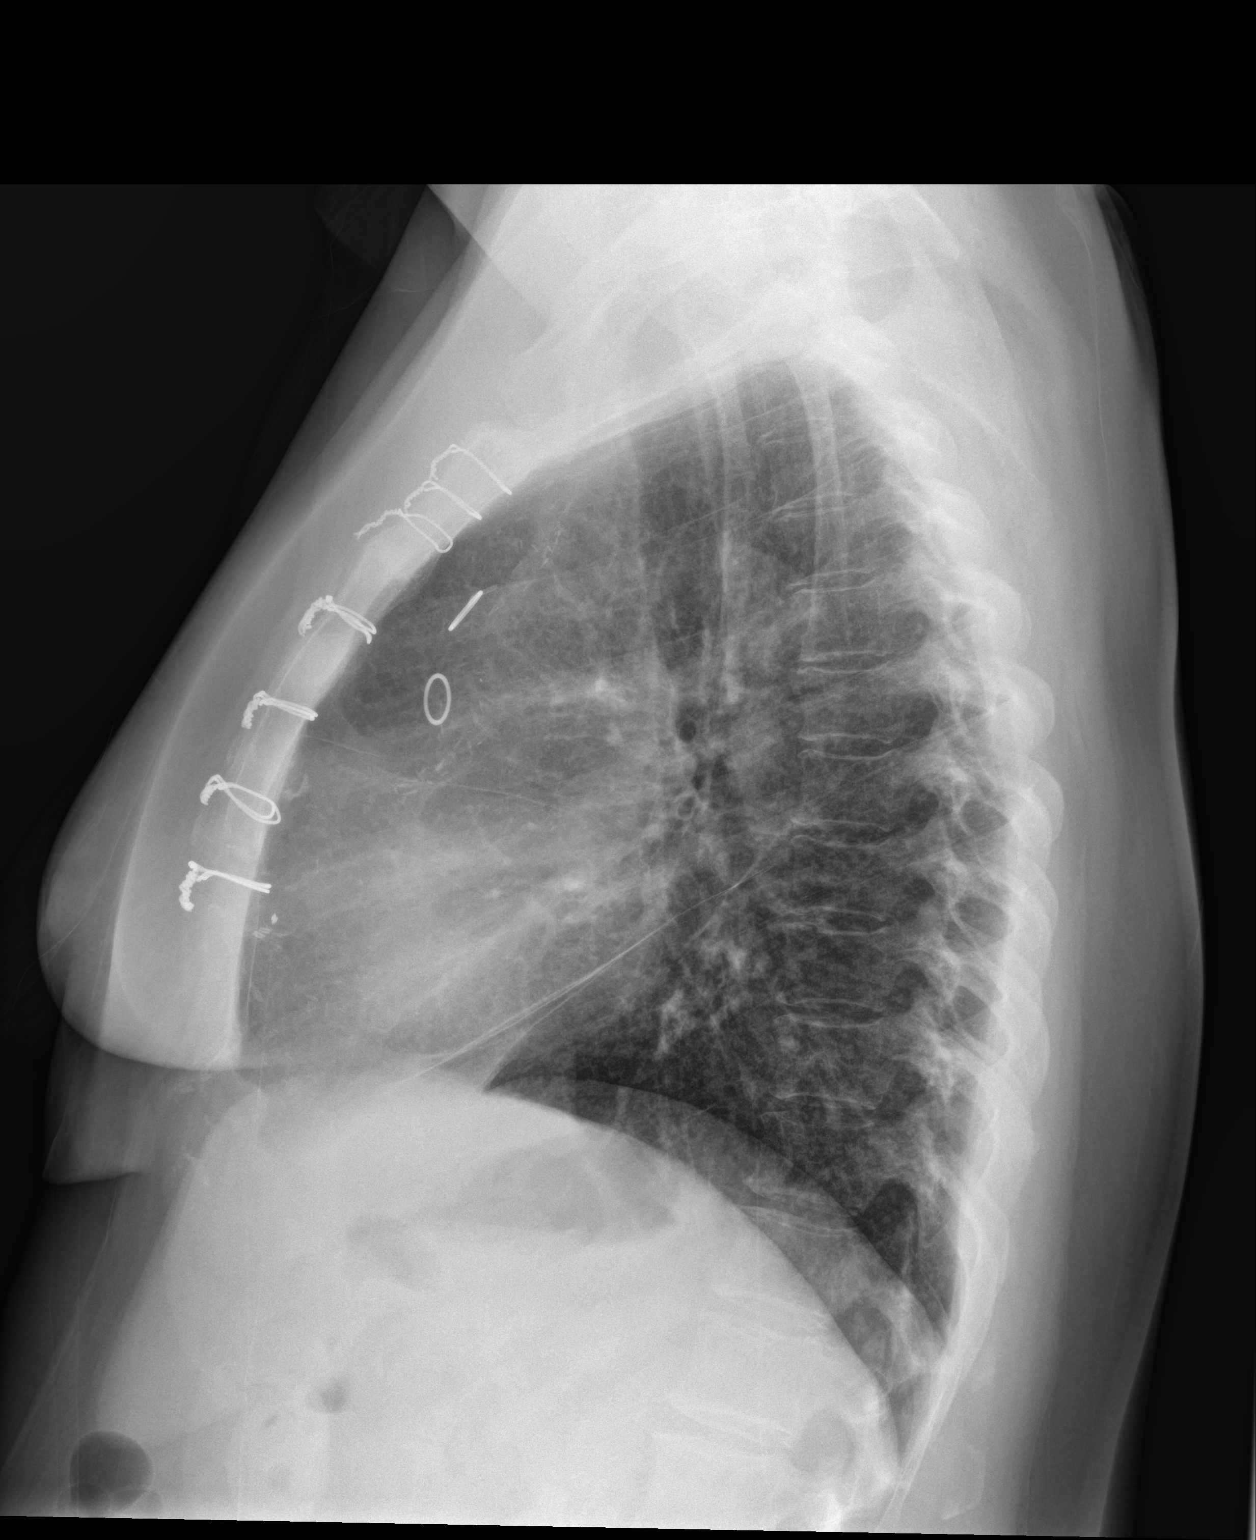

[2 of 2 positions shown; findings below may reference images not displayed]

FINDINGS: The heart size and mediastinal contours are within normal limits.
Both lungs are clear. Status post coronary bypass graft. The
visualized skeletal structures are unremarkable.
IMPRESSION: No active cardiopulmonary disease.

## 2023-06-23 ENCOUNTER — Other Ambulatory Visit: Payer: Self-pay | Admitting: Family Medicine

## 2023-06-24 ENCOUNTER — Encounter: Payer: Self-pay | Admitting: *Deleted

## 2023-06-24 NOTE — Telephone Encounter (Signed)
Requested medication (s) are due for refill today:   Yes  Requested medication (s) are on the active medication list:   Yes  Future visit scheduled:   No    Sent a MyChart message to call in or use MyChart to schedule an appt.   Last ordered: 03/23/2023 #90, 0 refills  Unable to refill because an OV and labs are due per protocol.      Requested Prescriptions  Pending Prescriptions Disp Refills   olmesartan (BENICAR) 40 MG tablet [Pharmacy Med Name: Olmesartan Medoxomil 40 MG Oral Tablet] 90 tablet 0    Sig: TAKE 1 TABLET BY MOUTH ONCE DAILY -  DISCONTINUE  LOSARTAN  50  MG  TABLETS     Cardiovascular:  Angiotensin Receptor Blockers Failed - 06/23/2023  9:42 AM      Failed - Cr in normal range and within 180 days    Creat  Date Value Ref Range Status  06/23/2022 1.05 (H) 0.50 - 1.03 mg/dL Final   Creatinine, Urine  Date Value Ref Range Status  03/26/2022 78 20 - 275 mg/dL Final         Failed - K in normal range and within 180 days    Potassium  Date Value Ref Range Status  06/23/2022 3.1 (L) 3.5 - 5.3 mmol/L Final  06/15/2012 4.3 3.5 - 5.1 mmol/L Final         Failed - Last BP in normal range    BP Readings from Last 1 Encounters:  09/26/22 (!) 160/80         Failed - Valid encounter within last 6 months    Recent Outpatient Visits           9 months ago Type 2 diabetes mellitus with other specified complication, without long-term current use of insulin [facility])   [place] [facility] Smitty Cords, DO   11 months ago Acquired hypothyroidism   [place] [facility] Smitty Cords, DO   1 year ago Fatigue, unspecified type   [place] [facility] Mecum, Oswaldo Conroy, [place]   1 year ago Atherosclerotic renal artery stenosis [facility])   [place] [facility] Smitty Cords, DO   1 year ago Annual physical exam   [place] [facility] Smitty Cords, [place]               Passed - Patient is not pregnant

## 2023-07-05 ENCOUNTER — Other Ambulatory Visit: Payer: Self-pay | Admitting: Family Medicine

## 2023-07-05 DIAGNOSIS — E1169 Type 2 diabetes mellitus with other specified complication: Secondary | ICD-10-CM

## 2023-07-07 NOTE — Telephone Encounter (Signed)
Requested medication (s) are due for refill today: routing for review  Requested medication (s) are on the active medication list: yes  Last refill:  10/16/22  Future visit scheduled: no  Notes to clinic:  Unable to refill per protocol due to failed labs, no updated results.      Requested Prescriptions  Pending Prescriptions Disp Refills   metFORMIN (GLUCOPHAGE-XR) 750 MG 24 hr tablet [Pharmacy Med Name: Metformin Hydrochloride 750mg  Extended-Release Tablet] 180 tablet 2    Sig: Take 2 tablets by mouth daily with breakfast.     Endocrinology:  Diabetes - Biguanides Failed - 07/05/2023  2:43 PM      Failed - Cr in normal range and within 360 days    Creat  Date Value Ref Range Status  06/23/2022 1.05 (H) 0.50 - 1.03 mg/dL Final   Creatinine, Urine  Date Value Ref Range Status  03/26/2022 78 20 - 275 mg/dL Final         Failed - HBA1C is between 0 and 7.9 and within 180 days    Hemoglobin A1C  Date Value Ref Range Status  09/26/2022 5.6 4.0 - 5.6 % Final  05/27/2022 6.7  Final         Failed - eGFR in normal range and within 360 days    GFR, Est African American  Date Value Ref Range Status  03/07/2019 116 > OR = 60 mL/min/1.8m2 Final   GFR, Est Non African American  Date Value Ref Range Status  03/07/2019 100 > OR = 60 mL/min/1.64m2 Final   GFR, Estimated  Date Value Ref Range Status  10/29/2021 >60 >60 mL/min Final    Comment:    (NOTE) Calculated using the CKD-EPI Creatinine Equation (2021)    eGFR  Date Value Ref Range Status  06/23/2022 62 > OR = 60 mL/min/1.10m2 Final         Failed - B12 Level in normal range and within 720 days    No results found for: "VITAMINB12"       Failed - Valid encounter within last 6 months    Recent Outpatient Visits           9 months ago Type 2 diabetes mellitus with other specified complication, without long-term current use of insulin [facility])   [place] [facility] Smitty Cords, DO    1 year ago Acquired hypothyroidism   [place] [facility] Smit[facility], DO   1 year ago Fatigue, unspecified type   [place] [facility] Mecum, Oswaldo Conroy, PA-C   1 year ago Atherosclerotic renal artery stenosis [facility])   [place] [facility] Smitty Cords, DO   1 year ago Annual physical exam    [facility] Smit[facility] Cords, DO              Failed - CBC within normal limits and completed in the last 12 months    WBC  Date Value Ref Range Status  06/23/2022 7.0 3.8 - 10.8 Thousand/uL Final   RBC  Date Value Ref Range Status  06/23/2022 4.66 3.80 - 5.10 Million/uL Final   Hemoglobin  Date Value Ref Range Status  06/23/2022 13.4 11.7 - 15.5 g/dL Final   HGB  Date Value Ref Range Status  06/15/2012 16.0 12.0 - 16.0 g/dL Final   HCT  Date Value Ref Range Status  06/23/2022 39.6 35.0 - 45.0 % Final  06/15/2012 46.6  35.0 - 47.0 % Final   MCHC  Date Value Ref Range Status  06/23/2022 33.8 32.0 - 36.0 g/dL Final   [facility]  Date Value Ref Range Status  06/23/2022 28.8 27.0 - 33.0 pg Final   MCV  Date Value Ref Range Status  06/23/2022 85.0 80.0 - 100.0 fL Final  06/15/2012 89 80 - 100 fL Final   No results found for: "PLTCOUNTKUC", "LABPLAT", "POCPLA" RDW  Date Value Ref Range Status  06/23/2022 13.1 11.0 - 15.0 % Final  06/15/2012 14.2 11.5 - 14.5 % Final

## 2023-07-28 ENCOUNTER — Other Ambulatory Visit: Payer: Self-pay | Admitting: Family Medicine

## 2023-07-28 DIAGNOSIS — E782 Mixed hyperlipidemia: Secondary | ICD-10-CM

## 2023-07-30 NOTE — Telephone Encounter (Signed)
Requested medication (s) are due for refill today: yes  Requested medication (s) are on the active medication list: yes    Last refill: 05/01/23 #90  0 refills  Future visit scheduled no  Notes to clinic:Failed due to labs, please review. Thank you.  Requested Prescriptions  Pending Prescriptions Disp Refills   rosuvastatin (CRESTOR) 40 MG tablet [Pharmacy Med Name: Rosuvastatin Calcium 40 MG Oral Tablet] 90 tablet 0    Sig: TAKE 1 TABLET BY MOUTH AT BEDTIME     Cardiovascular:  Antilipid - Statins 2 Failed - 07/28/2023  6:51 AM      Failed - Cr in normal range and within 360 days    Creat  Date Value Ref Range Status  06/23/2022 1.05 (H) 0.50 - 1.03 mg/dL Final   Creatinine, Urine  Date Value Ref Range Status  03/26/2022 78 20 - 275 mg/dL Final         Failed - Lipid Panel in normal range within the last 12 months    Cholesterol  Date Value Ref Range Status  02/05/2022 146 <200 mg/dL Final   LDL Cholesterol (Calc)  Date Value Ref Range Status  02/05/2022 84 mg/dL (calc) Final    Comment:    Reference range: <100 . Desirable range <100 mg/dL for primary prevention;   <70 mg/dL for patients with CHD or diabetic patients  with > or = 2 CHD risk factors. Marland Kitchen LDL-C is now calculated using the Martin-Hopkins  calculation, which is a validated novel method providing  better accuracy than the Friedewald equation in the  estimation of LDL-C.  Horald Pollen et al. Lenox Ahr. 1610;960(45): 2061-2068  (http://education.[facility].com/faq/FAQ164)    HDL  Date Value Ref Range Status  02/05/2022 34 (L) > OR = 50 mg/dL Final   Triglycerides  Date Value Ref Range Status  02/05/2022 185 (H) <150 mg/dL Final         Passed - Patient is not pregnant      Passed - Valid encounter within last 12 months    Recent Outpatient Visits           10 months ago Type 2 diabetes mellitus with other specified complication, without long-term current use of insulin [facility])   [place] Smit[facility] Cords, DO   1 year ago Acquired hypothyroidism   [place] [facility] Smit[facility] Cords, DO   1 year ago Fatigue, unspecified type   [place] Mecum, Oswaldo Conroy, [place]   1 year ago Atherosclerotic renal artery stenosis [facility])   [place] [facility] Smitty Cords, DO   1 year ago Annual physical exam   [place] [facility] [place], Netta Neat, [place]

## 2023-08-05 ENCOUNTER — Other Ambulatory Visit: Payer: Self-pay | Admitting: Family Medicine

## 2023-08-05 DIAGNOSIS — E039 Hypothyroidism, unspecified: Secondary | ICD-10-CM

## 2023-08-05 DIAGNOSIS — I10 Essential (primary) hypertension: Secondary | ICD-10-CM

## 2023-08-06 NOTE — Telephone Encounter (Signed)
Requested Prescriptions  Pending Prescriptions Disp Refills   levothyroxine (SYNTHROID) 50 MCG tablet [Pharmacy Med Name: Levothyroxine Sodium 50 MCG Oral Tablet] 45 tablet 0    Sig: TAKE 1 TABLET BY MOUTH EVERY OTHER DAY ALTERNATING  WITH  OTHER  DOSE   EVERY  OTHER  DAY     Endocrinology:  Hypothyroid Agents Failed - 08/06/2023 10:19 AM      Failed - TSH in normal range and within 360 days    TSH  Date Value Ref Range Status  08/06/2022 2.68 0.40 - 4.50 mIU/L Final         Passed - Valid encounter within last 12 months    Recent Outpatient Visits           10 months ago Type 2 diabetes mellitus with other specified complication, without long-term current use of insulin (HCC)   [place] [facility] [place], Netta Neat, DO   1 year ago Acquired hypothyroidism   [place] [facility] Smitty Cords, DO   1 year ago Fatigue, unspecified type   [place] [facility] Mecum, Oswaldo Conroy, [place]   1 year ago Atherosclerotic renal artery stenosis [facility])   [place] [facility] Smitty Cords, DO   1 year ago Annual physical exam   [place] [facility], Netta Neat, DO               amLODipine (NORVASC) 10 MG tablet [Pharmacy Med Name: amLODIPine Besylate 10 MG Oral Tablet] 90 tablet 0    Sig: Take 1 tablet by mouth once daily     Cardiovascular: Calcium Channel Blockers 2 Failed - 08/06/2023 10:19 AM      Failed - Last BP in normal range    BP Readings from Last 1 Encounters:  09/26/22 (!) 160/80         Failed - Valid encounter within last 6 months    Recent Outpatient Visits           10 months ago Type 2 diabetes mellitus with other specified complication, without long-term current use of insulin [facility])   [place] [facility] [place], Netta Neat, DO   1 year ago Acquired hypothyroidism   [place] [facility] [place], Netta Neat, DO   1 year ago Fatigue, unspecified type   [place] [facility] Mecum, Oswaldo Conroy, [place]   1 year ago Atherosclerotic renal artery stenosis [facility])   [place] [facility] [place], Netta Neat, DO   1 year ago Annual physical exam    [facility] Smitty Cords, DO              Passed - Last Heart Rate in normal range    Pulse Readings from Last 1 Encounters:  09/26/22 70          levothyroxine (SYNTHROID) 75 MCG tablet [Pharmacy Med Name: Levothyroxine Sodium 75 MCG Oral Tablet] 45 tablet 0    Sig: TAKE 1 TABLET BY MOUTH EVERY OTHER DAY AND  ALTERNATE  WITH   OTHER  PILL     Endocrinology:  Hypothyroid Agents Failed - 08/06/2023 10:19 AM      Failed - TSH in normal range and within 360 days    TSH  Date Value Ref Range Status  08/06/2022 2.68 0.40 - 4.50 mIU/L Final  Passed - Valid encounter within last 12 months    Recent Outpatient Visits           10 months ago Type 2 diabetes mellitus with other specified complication, without long-term current use of insulin [facility])   [place] [facility] Smitty Cords, DO   1 year ago Acquired hypothyroidism   [place] [facility] Smitty Cords, DO   1 year ago Fatigue, unspecified type   [place] [facility] Mecum, Oswaldo Conroy, [place]   1 year ago Atherosclerotic renal artery stenosis [facility])   [place] [facility] Smitty Cords, DO   1 year ago Annual physical exam   [place] [facility] Smitty Cords, [place]

## 2023-09-03 ENCOUNTER — Other Ambulatory Visit: Payer: Self-pay | Admitting: Internal Medicine

## 2023-09-03 DIAGNOSIS — E782 Mixed hyperlipidemia: Secondary | ICD-10-CM

## 2023-09-07 NOTE — Telephone Encounter (Signed)
 Requested Prescriptions  Pending Prescriptions Disp Refills   rosuvastatin  (CRESTOR ) 40 MG tablet [Pharmacy Med Name: Rosuvastatin  Calcium  40 MG Oral Tablet] 90 tablet 0    Sig: TAKE 1 TABLET BY MOUTH AT BEDTIME     Cardiovascular:  Antilipid - Statins 2 Failed - 09/07/2023 10:24 AM      Failed - Cr in normal range and within 360 days    Creat  Date Value Ref Range Status  06/23/2022 1.05 (H) 0.50 - 1.03 mg/dL Final   Creatinine, Urine  Date Value Ref Range Status  03/26/2022 78 20 - 275 mg/dL Final         Failed - Lipid Panel in normal range within the last 12 months    Cholesterol  Date Value Ref Range Status  02/05/2022 146 <200 mg/dL Final   LDL Cholesterol (Calc)  Date Value Ref Range Status  02/05/2022 84 mg/dL (calc) Final    Comment:    Reference range: <100 . Desirable range <100 mg/dL for primary prevention;   <70 mg/dL for patients with CHD or diabetic patients  with > or = 2 CHD risk factors. [name] LDL-C is now calculated using the Martin-Hopkins  calculation, which is a validated novel method providing  better accuracy than the Friedewald equation in the  estimation of LDL-C.  Gladis APPLETHWAITE et al. SANDREA. 7986;689(80): 2061-2068  (http://education.[facility].com/faq/FAQ164)    HDL  Date Value Ref Range Status  02/05/2022 34 (L) > OR = 50 mg/dL Final   Triglycerides  Date Value Ref Range Status  02/05/2022 185 (H) <150 mg/dL Final         Passed - Patient is not pregnant      Passed - Valid encounter within last 12 months    Recent Outpatient Visits           11 months ago Type 2 diabetes mellitus with other specified complication, without long-term current use of insulin [facility])   [place] [facility] Marsa PARAS, DO   1 year ago Acquired hypothyroidism   [place] [facility] Marsa PARAS, DO   1 year ago Fatigue, unspecified type   [place] [facility] Mecum, [place] B[place]VO,  [place]   1 year ago Atherosclerotic renal artery stenosis [facility])   [place] [facility] [place] Marsa PARAS, DO   1 year ago Annual physical exam   [place] [facility], Marsa PARAS, [place]

## 2023-09-10 ENCOUNTER — Other Ambulatory Visit: Payer: Self-pay | Admitting: Family Medicine

## 2023-09-10 DIAGNOSIS — I2510 Atherosclerotic heart disease of native coronary artery without angina pectoris: Secondary | ICD-10-CM

## 2023-09-11 NOTE — Telephone Encounter (Signed)
Requested medication (s) are due for refill today: yes  Requested medication (s) are on the active medication list: yes  Last refill:  09/30/22  Future visit scheduled: no  Notes to clinic:  Unable to refill per protocol due to failed labs, no updated results.      Requested Prescriptions  Pending Prescriptions Disp Refills   BRILINTA 90 MG TABS tablet [Pharmacy Med Name: BRILINTA 90mg  Tablet] 180 tablet 2    Sig: Take 1 tablet by mouth twice daily.     Hematology: Antiplatelets - ticagrelor Failed - 09/11/2023  8:15 AM      Failed - Cr in normal range and within 180 days    Creat  Date Value Ref Range Status  06/23/2022 1.05 (H) 0.50 - 1.03 mg/dL Final   Creatinine, Urine  Date Value Ref Range Status  03/26/2022 78 20 - 275 mg/dL Final         Failed - HCT in normal range and within 180 days    HCT  Date Value Ref Range Status  06/23/2022 39.6 35.0 - 45.0 % Final  06/15/2012 46.6 35.0 - 47.0 % Final         Failed - HGB in normal range and within 180 days    Hemoglobin  Date Value Ref Range Status  06/23/2022 13.4 11.7 - 15.5 g/dL Final   HGB  Date Value Ref Range Status  06/15/2012 16.0 12.0 - 16.0 g/dL Final         Failed - PLT in normal range and within 180 days    Platelets  Date Value Ref Range Status  06/23/2022 177 140 - 400 Thousand/uL Final   Platelet  Date Value Ref Range Status  06/15/2012 295 150 - 440 x10 3/mm 3 Final         Failed - Valid encounter within last 6 months    Recent Outpatient Visits           11 months ago Type 2 diabetes mellitus with other specified complication, without long-term current use of insulin [facility])   [place] [facility] Smitty Cords, DO   1 year ago Acquired hypothyroidism   [place] [facility] Smitty Cords, DO   1 year ago Fatigue, unspecified type   [facility] Mecum, Oswaldo Conroy, [place]   1 year ago Atherosclerotic renal  artery stenosis [facility])   [place] [facility] Smitty Cords, DO   1 year ago Annual physical exam   [place] [facility] Smitty Cords, DO              Passed - Last Heart Rate in normal range    Pulse Readings from Last 1 Encounters:  09/26/22 70

## 2023-11-30 ENCOUNTER — Other Ambulatory Visit: Payer: Self-pay | Admitting: Family Medicine

## 2023-11-30 DIAGNOSIS — E039 Hypothyroidism, unspecified: Secondary | ICD-10-CM

## 2023-11-30 DIAGNOSIS — I10 Essential (primary) hypertension: Secondary | ICD-10-CM

## 2023-12-01 NOTE — Telephone Encounter (Signed)
 Requested Prescriptions  Pending Prescriptions Disp Refills   amLODipine (NORVASC) 10 MG tablet [Pharmacy Med Name: amLODIPine Besylate 10 MG Oral Tablet] 90 tablet 0    Sig: Take 1 tablet by mouth once daily     Cardiovascular: Calcium Channel Blockers 2 Failed - 12/01/2023  3:49 PM      Failed - Last BP in normal range    BP Readings from Last 1 Encounters:  09/26/22 (!) 160/80         Failed - Valid encounter within last 6 months    Recent Outpatient Visits   None            Passed - Last Heart Rate in normal range    Pulse Readings from Last 1 Encounters:  09/26/22 70          levothyroxine (SYNTHROID) 75 MCG tablet [Pharmacy Med Name: Levothyroxine Sodium 75 MCG Oral Tablet] 45 tablet 0    Sig: TAKE 1 TABLET BY MOUTH EVERY OTHER DAY AND ALTERNATE WITH 50 MCG TABLET.     Endocrinology:  Hypothyroid Agents Failed - 12/01/2023  3:49 PM      Failed - TSH in normal range and within 360 days    TSH  Date Value Ref Range Status  08/06/2022 2.68 0.40 - 4.50 mIU/L Final         Failed - Valid encounter within last 12 months    Recent Outpatient Visits   None             levothyroxine (SYNTHROID) 50 MCG tablet [Pharmacy Med Name: Levothyroxine Sodium 50 MCG Oral Tablet] 45 tablet 0    Sig: TAKE 1 TABLET BY MOUTH EVERY OTHER DAY, ALTERNATING WITH OTHER DOSE OF EVERY OTHER DAY.     Endocrinology:  Hypothyroid Agents Failed - 12/01/2023  3:49 PM      Failed - TSH in normal range and within 360 days    TSH  Date Value Ref Range Status  08/06/2022 2.68 0.40 - 4.50 mIU/L Final         Failed - Valid encounter within last 12 months    Recent Outpatient Visits   None

## 2023-12-08 ENCOUNTER — Ambulatory Visit: Admitting: Family Medicine

## 2023-12-09 ENCOUNTER — Ambulatory Visit (INDEPENDENT_AMBULATORY_CARE_PROVIDER_SITE_OTHER): Admitting: Family Medicine

## 2023-12-09 ENCOUNTER — Encounter: Payer: Self-pay | Admitting: Family Medicine

## 2023-12-09 ENCOUNTER — Other Ambulatory Visit: Payer: Self-pay | Admitting: Family Medicine

## 2023-12-09 VITALS — BP 170/80 | HR 65 | Ht 61.0 in | Wt 175.1 lb

## 2023-12-09 DIAGNOSIS — G5601 Carpal tunnel syndrome, right upper limb: Secondary | ICD-10-CM

## 2023-12-09 DIAGNOSIS — E039 Hypothyroidism, unspecified: Secondary | ICD-10-CM | POA: Diagnosis not present

## 2023-12-09 DIAGNOSIS — I2510 Atherosclerotic heart disease of native coronary artery without angina pectoris: Secondary | ICD-10-CM

## 2023-12-09 DIAGNOSIS — M65331 Trigger finger, right middle finger: Secondary | ICD-10-CM

## 2023-12-09 DIAGNOSIS — E782 Mixed hyperlipidemia: Secondary | ICD-10-CM

## 2023-12-09 DIAGNOSIS — Z1211 Encounter for screening for malignant neoplasm of colon: Secondary | ICD-10-CM

## 2023-12-09 DIAGNOSIS — E1169 Type 2 diabetes mellitus with other specified complication: Secondary | ICD-10-CM | POA: Diagnosis not present

## 2023-12-09 DIAGNOSIS — I10 Essential (primary) hypertension: Secondary | ICD-10-CM | POA: Diagnosis not present

## 2023-12-09 DIAGNOSIS — Z0000 Encounter for general adult medical examination without abnormal findings: Secondary | ICD-10-CM

## 2023-12-09 DIAGNOSIS — Z7984 Long term (current) use of oral hypoglycemic drugs: Secondary | ICD-10-CM

## 2023-12-09 DIAGNOSIS — Z1231 Encounter for screening mammogram for malignant neoplasm of breast: Secondary | ICD-10-CM

## 2023-12-09 LAB — POCT GLYCOSYLATED HEMOGLOBIN (HGB A1C): Hemoglobin A1C: 5.9 % — AB (ref 4.0–5.6)

## 2023-12-09 MED ORDER — OLMESARTAN MEDOXOMIL 40 MG PO TABS: 40.0000 mg | ORAL_TABLET | Freq: Every day | ORAL | 1 refills | Status: DC

## 2023-12-09 MED ORDER — HYDRALAZINE HCL 25 MG PO TABS: 25.0000 mg | ORAL_TABLET | Freq: Two times a day (BID) | ORAL | 2 refills | Status: DC | PRN

## 2023-12-09 MED ORDER — CARVEDILOL 25 MG PO TABS: 25.0000 mg | ORAL_TABLET | Freq: Two times a day (BID) | ORAL | 1 refills | Status: DC

## 2023-12-09 MED ORDER — ROSUVASTATIN CALCIUM 40 MG PO TABS: 40.0000 mg | ORAL_TABLET | Freq: Every day | ORAL | 1 refills | Status: DC

## 2023-12-09 MED ORDER — LEVOTHYROXINE SODIUM 50 MCG PO TABS: ORAL_TABLET | ORAL | 1 refills | Status: DC

## 2023-12-09 MED ORDER — LEVOTHYROXINE SODIUM 75 MCG PO TABS: ORAL_TABLET | ORAL | 1 refills | Status: DC

## 2023-12-09 MED ORDER — AMLODIPINE BESYLATE 10 MG PO TABS: 10.0000 mg | ORAL_TABLET | Freq: Every day | ORAL | 1 refills | Status: DC

## 2023-12-09 NOTE — Progress Notes (Signed)
 Subjective:    Patient ID: Morgan Wright, female    DOB: Dec 21, 1965, 58 y.o.   MRN: [id number]  Morgan Wright is a 58 y.o. female presenting on 12/09/2023 for Diabetes, Trigger Finger, and Hypertension   HPI  Discussed the use of AI scribe software for clinical note transcription with the patient, who gave verbal consent to proceed.  History of Present Illness   She is a 58 year old female with diabetes and hypertension who presents for a follow-up visit and medication refill.      R middle finger trigger She reports issues with her right middle finger, described as a 'trigger finger' that gets stuck and causes pain. This started a couple of months ago and has been worsening. Her left middle finger is also starting to stick. She experiences numbness and tingling in her thumb, index, and middle fingers, which she has not previously associated with carpal tunnel syndrome.  Type 2 Diabetes A1c today 5.9, improved control CBGs:Controlled  Meds: Metformin XR 750 x 2 = 1500 daily with supper Currently on ARB Lifestyle: - Diet (not following DM diet - she has tried to improve but not always following a specific diet,  - Exercise (limited currently) - Goal to see Eye Doctor for diabetic exam Denies hypoglycemia, polyuria, visual changes, numbness or tingling.   CHRONIC HTN:  Elevated BP in office, not checking regularly at home. Her blood pressure was high today, which she attributes to a stressful day involving her car breaking down. She is currently taking amlodipine 10 mg and carvedilol 25 mg twice daily for hypertension. She used to take a diuretic but stopped due to low sodium levels. No headache or vision changes associated with her blood pressure. Current Meds - Amlodipine 10mg  daily, Carvedilol 25 TWICE A DAY, Olmesartan 40mg  Cannot take hydrochlorothiazide/chlorthalidone Denies CP, dyspnea, HA, edema, dizziness / lightheadedness   Hypothyroidism Prior history of low  thyroid hormone Previously on levothyroxine daily, needs re order   HYPERLIPIDEMIA: Known CAD and history of HLD Controlled on Rosuvastatin 40mg  daily   CAD s/p CABG x 3 2010 Her Cardiologist has retired at [facility], has not been back in past 4-5 years Out of anti platelet anti coag meds for long time high cost. Has taken Brilinta most recently.    She has never had a mammogram and is interested in scheduling one. She has not had a colonoscopy or used Cologuard due to difficulty obtaining a sample.  She does not have a GYN and has not had a Pap smear in a long time.   Past Surgical History:  Procedure Laterality Date   CARDIAC SURGERY     CESAREAN SECTION     x 3 - 1984-1994   MICROLARYNGOSCOPY W/VOCAL CORD INJECTION Bilateral 01/28/2019   Procedure: MICROLARYNGOSCOPY WITH VOCAL CORD EXCISION;  Surgeon: Linus Salmons, MD;  Location: [facility];  Service: ENT;  Laterality: Bilateral;   TONSILLECTOMY       Health Maintenance:  Future GYN referral for Pap Smear     12/09/2023    2:33 PM 07/04/2022    2:56 PM 03/26/2022    1:20 PM  Depression screen PHQ 2/9  Decreased Interest 0 0 0  Down, Depressed, Hopeless 0 0 0  PHQ - 2 Score 0 0 0  Altered sleeping 0 3 0  Tired, decreased energy 0 3 0  Change in appetite 0 0 0  Feeling bad or failure about yourself  0 0 0  Trouble concentrating 0  0 0  Moving slowly or fidgety/restless 0 2   Suicidal thoughts 0 0 0  PHQ-9 Score 0 8 0  Difficult doing work/chores  Very difficult Not difficult at all       12/09/2023    2:33 PM 07/04/2022    2:56 PM 03/26/2022    1:20 PM 11/05/2021    3:38 PM  GAD 7 : Generalized Anxiety Score  Nervous, Anxious, on Edge 0 0 0 0  Control/stop worrying 0 0 0 0  Worry too much - different things 0 0 0 0  Trouble relaxing 0 0 0 0  Restless 0 0 0 0  Easily annoyed or irritable 0 0 0 0  Afraid - awful might happen 0 0 0 0  Total GAD 7 Score 0 0 0 0  Anxiety Difficulty  Not  difficult at all Not difficult at all Not difficult at all    Social History   Tobacco Use   Smoking status: Every Day    Current packs/day: 0.75    Average packs/day: 0.8 packs/day for 40.0 years (30.0 ttl pk-yrs)    Types: Cigarettes   Smokeless tobacco: Never  Vaping Use   Vaping status: Never Used  Substance Use Topics   Alcohol use: No   Drug use: Never    Review of Systems Per HPI unless specifically indicated above     Objective:    BP (!) 170/80 (BP Location: Left Arm, Cuff Size: Normal)   Pulse 65   Ht 5\' 1"  (1.549 m)   Wt 175 lb 2 oz (79.4 kg)   SpO2 98%   BMI 33.09 kg/m   Wt Readings from Last 3 Encounters:  12/09/23 175 lb 2 oz (79.4 kg)  09/26/22 166 lb (75.3 kg)  07/04/22 158 lb 12.8 oz (72 kg)    Physical Exam Vitals and nursing note reviewed.  Constitutional:      General: She is not in acute distress.    Appearance: She is well-developed. She is not diaphoretic.     Comments: Well-appearing, comfortable, cooperative  HENT:     Head: Normocephalic and atraumatic.  Eyes:     General:        Right eye: No discharge.        Left eye: No discharge.     Conjunctiva/sclera: Conjunctivae normal.  Neck:     Thyroid: No thyromegaly.  Cardiovascular:     Rate and Rhythm: Normal rate and regular rhythm.     Heart sounds: Normal heart sounds. No murmur heard. Pulmonary:     Effort: Pulmonary effort is normal. No respiratory distress.     Breath sounds: Normal breath sounds. No wheezing or rales.  Musculoskeletal:        General: Normal range of motion.     Cervical back: Normal range of motion and neck supple.  Lymphadenopathy:     Cervical: No cervical adenopathy.  Skin:    General: Skin is warm and dry.     Findings: No erythema or rash.  Neurological:     Mental Status: She is alert and oriented to person, place, and time.  Psychiatric:        Behavior: Behavior normal.     Comments: Well groomed, good eye contact, normal speech and thoughts     Diabetic Foot Exam - Simple   Simple Foot Form Diabetic Foot exam was performed with the following findings: Yes 12/09/2023  3:05 PM  Visual Inspection See comments: Yes Sensation Testing Intact to  touch and monofilament testing bilaterally: Yes Pulse Check Posterior Tibialis and Dorsalis pulse intact bilaterally: Yes Comments Bilateral with corns callus. No ulceration. Intact to monofilament.      Results for orders placed or performed in visit on 12/09/23  POCT glycosylated hemoglobin (Hb A1C)   Collection Time: 12/09/23  3:18 PM  Result Value Ref Range   Hemoglobin A1C 5.9 (A) 4.0 - 5.6 %   HbA1c POC (<> result, manual entry)     HbA1c, POC (prediabetic range)     HbA1c, POC (controlled diabetic range)        Assessment & Plan:   Problem List Items Addressed This Visit     Acquired hypothyroidism   Relevant Medications   carvedilol (COREG) 25 MG tablet   levothyroxine (SYNTHROID) 75 MCG tablet   levothyroxine (SYNTHROID) 50 MCG tablet   Other Relevant Orders   TSH   T4, free   Coronary artery disease involving native coronary artery of native heart without angina pectoris   Relevant Medications   amLODipine (NORVASC) 10 MG tablet   carvedilol (COREG) 25 MG tablet   olmesartan (BENICAR) 40 MG tablet   rosuvastatin (CRESTOR) 40 MG tablet   hydrALAZINE (APRESOLINE) 25 MG tablet   Other Relevant Orders   Ambulatory referral to Cardiology   AMB Referral VBCI Care Management   Essential hypertension   Relevant Medications   amLODipine (NORVASC) 10 MG tablet   carvedilol (COREG) 25 MG tablet   olmesartan (BENICAR) 40 MG tablet   rosuvastatin (CRESTOR) 40 MG tablet   hydrALAZINE (APRESOLINE) 25 MG tablet   Other Relevant Orders   Comprehensive metabolic panel with GFR   AMB Referral VBCI Care Management   Type 2 diabetes mellitus with other specified complication (HCC) - Primary   Relevant Medications   olmesartan (BENICAR) 40 MG tablet   rosuvastatin  (CRESTOR) 40 MG tablet   Other Relevant Orders   POCT glycosylated hemoglobin (Hb A1C) (Completed)   AMB Referral VBCI Care Management   Other Visit Diagnoses       Encounter for screening mammogram for malignant neoplasm of breast       Relevant Orders   MM 3D SCREENING MAMMOGRAM BILATERAL BREAST     Screening for colon cancer       Relevant Orders   Ambulatory referral to Gastroenterology     Mixed hyperlipidemia       Relevant Medications   amLODipine (NORVASC) 10 MG tablet   carvedilol (COREG) 25 MG tablet   olmesartan (BENICAR) 40 MG tablet   rosuvastatin (CRESTOR) 40 MG tablet   hydrALAZINE (APRESOLINE) 25 MG tablet   Other Relevant Orders   Comprehensive metabolic panel with GFR   AMB Referral VBCI Care Management     Trigger middle finger of right hand       Relevant Orders   Ambulatory referral to Orthopedic Surgery     Right carpal tunnel syndrome       Relevant Orders   Ambulatory referral to Orthopedic Surgery        Diabetes Mellitus Type 2 A1c controlled 5.9 Ongoing management with metformin. Emphasized regular monitoring and follow-up. Recommended diabetic eye exam. - Perform finger prick glucose test today. - Schedule follow-up in 4 months for full blood panel and physical examination. - Recommend diabetic eye exam; she will contact vision center for scheduling.  Hypertension Blood pressure elevated, likely stress-related. Current medications: amlodipine, carvedilol olmesartan Cannot tolerate Thiazide Discussed hydralazine addition for high readings. Emphasized home monitoring. -  Prescribe hydralazine 25 mg twice daily as needed for blood pressure readings over 160/100. Can switch to regular usage if indicated. - Advise home blood pressure monitoring. - Recheck blood pressure before she leaves today.  Hyperlipidemia Managed with rosuvastatin. Discussed medication refills and adherence importance. - Renew prescription for  rosuvastatin.  Hypothyroidism Managed with alternating levothyroxine doses. Discussed medication refills and adherence importance. - Renew prescription for levothyroxine 50 mcg and 75 mcg, alternating doses.  R Middle Trigger Finger Right middle finger trigger finger with pain and limited movement. Left middle finger showing symptoms. Possible carpal tunnel syndrome. Discussed orthopedic evaluation and treatment options. - Refer to orthopedics for evaluation and management of trigger finger and possible carpal tunnel syndrome.  General Health Maintenance No mammogram or colonoscopy. Discussed importance of routine screenings. Colonoscopy preferred over Cologuard. - Order mammogram; she will contact breast center for scheduling. - Refer to GI for colonoscopy. - Plan future GYN referral for Pap smear screening.  Medication Cost Management Difficulty affording Brilinta and Eliquis. Discussed pharmacist assistance for cost-effective alternatives or programs. - Refer to pharmacy team for assistance with medication cost management.      Route chart to [facility]  Orders Placed This Encounter  Procedures   MM 3D SCREENING MAMMOGRAM BILATERAL BREAST    Standing Status:   Future    Expiration Date:   12/08/2024    Reason for Exam (SYMPTOM  OR DIAGNOSIS REQUIRED):   Screening bilateral 3D Mammogram Tomo    Preferred imaging location?:   [facility]   Comprehensive metabolic panel with GFR   TSH   T4, free   Ambulatory referral to Gastroenterology    Referral Priority:   Routine    Referral Type:   Consultation    Referral Reason:   Specialty Services Required    Number of Visits Requested:   1   Ambulatory referral to Cardiology    Referral Priority:   Routine    Referral Type:   Consultation    Referral Reason:   Specialty Services Required    Number of Visits Requested:   1   Ambulatory referral to Orthopedic Surgery    Referral Priority:   Routine    Referral Type:    Surgical    Referral Reason:   Specialty Services Required    Requested Specialty:   Orthopedic Surgery    Number of Visits Requested:   1   AMB Referral VBCI Care Management    Referral Priority:   Routine    Referral Type:   Consultation    Referral Reason:   Care Coordination    Number of Visits Requested:   1   POCT glycosylated hemoglobin (Hb A1C)    Meds ordered this encounter  Medications   amLODipine (NORVASC) 10 MG tablet    Sig: Take 1 tablet (10 mg total) by mouth daily.    Dispense:  90 tablet    Refill:  1    Add future refills   carvedilol (COREG) 25 MG tablet    Sig: Take 1 tablet (25 mg total) by mouth 2 (two) times daily.    Dispense:  180 tablet    Refill:  1    Add future refills   levothyroxine (SYNTHROID) 75 MCG tablet    Sig: TAKE 1 TABLET BY MOUTH EVERY OTHER DAY AND ALTERNATE WITH 50 MCG TABLET.    Dispense:  45 tablet    Refill:  1    Add future refills   levothyroxine (  SYNTHROID) 50 MCG tablet    Sig: TAKE 1 TABLET BY MOUTH EVERY OTHER DAY, ALTERNATING WITH OTHER DOSE OF 75 MCG EVERY OTHER DAY.    Dispense:  45 tablet    Refill:  1    Add future refills   olmesartan (BENICAR) 40 MG tablet    Sig: Take 1 tablet (40 mg total) by mouth daily.    Dispense:  90 tablet    Refill:  1    Add future refills   rosuvastatin (CRESTOR) 40 MG tablet    Sig: Take 1 tablet (40 mg total) by mouth at bedtime.    Dispense:  90 tablet    Refill:  1    Add future refills   hydrALAZINE (APRESOLINE) 25 MG tablet    Sig: Take 1 tablet (25 mg total) by mouth 2 (two) times daily as needed (if BP >160/100).    Dispense:  60 tablet    Refill:  2    Follow up plan: Return in about 4 months (around 04/09/2024) for 4 month fasting lab then 1 week later Annual.  Future labs ordered for 04/13/24 add urine microalbumin  Domingo Friend, DO [facility] 12/09/2023, 2:44 PM

## 2023-12-09 NOTE — Patient Instructions (Addendum)
 Thank you for coming to the office today.  Trigger Finger  Trigger finger, also called stenosing tenosynovitis, is a condition that causes a finger or thumb to get stuck in a bent position. Each finger has tough, cord-like tissue (tendon) that connects muscle to bone, and each tendon passes through a tunnel of tissue (tendon sheath). The tendon sheaths are held close to the bone by a pulley. There is a pulley called the A1 pulley that is involved in the triggering of a finger or thumb. To move your finger, your tendon needs to glide freely through the sheath. Trigger finger happens when the tendon or the sheath thickens, making it difficult to bend or straighten your finger as the thickened tendon gets stuck in the A1 pulley. Trigger finger can affect any of the fingers or the thumbs. Mild cases may clear up with rest and medicine. Severe cases require more treatment. What are the causes? Trigger finger or thumb is caused by a thickened finger tendon or tendon sheath. The cause of this thickening is not known. What increases the risk? The following factors may make you more likely to develop this condition: Doing the same movements many times (repetitive activity) that require a strong grip. Having certain health conditions. These include rheumatoid arthritis, gout, carpal tunnel syndrome, or diabetes. Being 68-65 years old. Being female. What are the signs or symptoms? Symptoms of this condition include: Pain when bending or straightening your finger. Tenderness, swelling, or a lump in the palm of your hand just below the finger joint. Hearing a noise like a pop or a snap when you try to straighten your finger. Feeling a catch or locked feeling when you try to straighten your finger. Being unable to straighten your finger without help from your other hand. How is this diagnosed? This condition is diagnosed based on your symptoms and a physical exam. How is this treated? This condition may be  treated by: Resting your finger and avoiding activities that make symptoms worse. Wearing a finger splint to keep your finger extended. Taking NSAIDs, such as ibuprofen, to relieve pain and swelling around the tendon. Doing gentle exercises to stretch the finger as told by your health care provider. Having medicine that reduces swelling and inflammation (steroids) injected into the tendon sheath. Injections may need to be repeated. Trigger finger release. This surgery is done to open the pulley. This may be done if other treatments do not work and you cannot straighten or bend your finger. You may need hand therapy after surgery. Follow these instructions at home: If you have a removable splint: Wear the splint as told by your provider. Remove it only as told by your provider. Check the skin around the splint every day. Tell your provider about any concerns. Loosen the splint if your fingers tingle, become numb, or turn cold and blue. Keep the splint clean and dry. If the splint is not waterproof: Do not let it get wet. Cover it with a watertight covering when you take a bath or shower. Managing pain, stiffness, and swelling     If told, put ice on the painful area. If you have a removable splint, remove it as told by your health care provider. Put ice in a plastic bag. Place a towel between your skin and the bag or between your splint and the bag. Leave the ice on for 20 minutes, 2-3 times a day. If told, apply heat to the affected area as often as told by your provider. Use  the heat source that your provider recommends, such as a moist heat pack or a heating pad. Place a towel between your skin and the heat source. Leave the heat on for 20-30 minutes. If your skin turns bright red, remove the ice or heat right away to prevent skin damage. The risk of damage is higher if you cannot feel pain, heat, or cold. Activity Rest your finger as told by your provider. Avoid activities that make  the pain worse. Return to your normal activities as told by your provider. Ask your provider what activities are safe for you. Do exercises as told by your provider. Ask your provider when it is safe to drive if you have a splint on your hand. General instructions Take over-the-counter and prescription medicines only as told by your provider. Keep all follow-up visits. These are needed to see how you are progressing. Contact a health care provider if: Your symptoms are not improving with home care. This information is not intended to replace advice given to you by your health care provider. Make sure you discuss any questions you have with your health care provider. Document Revised: 03/28/2022 Document Reviewed: 03/28/2022 Elsevier Patient Education  2024 [employer].  Recommend Diabetic Eye Exam   [facility] [address], [place], [place] [address] Phone: [phone] [email or web address]  [facility]   Address: [address] [place], [place] [address] Phone: [phone]  Website: [email or web address]  -------------  For Mammogram screening for breast cancer   Call the Imaging Center below anytime to schedule your own appointment now that order has been placed.  [facility] at [facility] [address], [address] [place], [place] [address] Phone: [phone]  ----------- Referral to Colonoscopy  [facility] [address] [place], [place] [address] Hours: 8AM-5PM Phone: [phone]  Please schedule a Follow-up Appointment to: Return in about 4 months (around 04/09/2024) for 4 month fasting lab then 1 week later Annual.  If you have any other questions or concerns, please feel free to call the office or send a message through MyChart. You may also schedule an earlier appointment if necessary.  Additionally, you may be receiving a survey about your experience at our office  within a few days to 1 week by e-mail or mail. We value your feedback.  Domingo Friend, DO [facility], [place]

## 2023-12-10 ENCOUNTER — Telehealth: Payer: Self-pay | Admitting: *Deleted

## 2023-12-10 NOTE — Progress Notes (Signed)
 Care Guide Pharmacy Note  12/10/2023 Name: [name] MRN: [id number] DOB: 07-14-1966  Referred By: Raina Bunting, DO Reason for referral: Complex Care Management (Outreach to schedule referral with pharmacist )   Morgan Wright is a 58 y.o. year old female who is a primary care patient of Raina Bunting, DO.  Nychelle A Keeler was referred to the pharmacist for assistance related to: CAD  Successful contact was made with the patient to discuss pharmacy services including being ready for the pharmacist to call at least 5 minutes before the scheduled appointment time and to have medication bottles and any blood pressure readings ready for review. The patient agreed to meet with the pharmacist via telephone visit on 12/18/2023  Kandis Ormond, CMA [place]  [facility], [facility] Guide Direct Dial: [phone]  Fax: [phone] Website: [email or web address]

## 2023-12-18 ENCOUNTER — Other Ambulatory Visit: Payer: Self-pay

## 2023-12-18 ENCOUNTER — Encounter: Payer: Self-pay | Admitting: Pharmacist

## 2023-12-18 ENCOUNTER — Telehealth: Payer: Self-pay | Admitting: Family Medicine

## 2023-12-18 ENCOUNTER — Other Ambulatory Visit (HOSPITAL_COMMUNITY): Payer: Self-pay

## 2023-12-18 ENCOUNTER — Other Ambulatory Visit: Payer: Self-pay | Admitting: Family Medicine

## 2023-12-18 ENCOUNTER — Telehealth: Payer: Self-pay

## 2023-12-18 ENCOUNTER — Other Ambulatory Visit

## 2023-12-18 DIAGNOSIS — I2510 Atherosclerotic heart disease of native coronary artery without angina pectoris: Secondary | ICD-10-CM

## 2023-12-18 DIAGNOSIS — I701 Atherosclerosis of renal artery: Secondary | ICD-10-CM

## 2023-12-18 DIAGNOSIS — Z951 Presence of aortocoronary bypass graft: Secondary | ICD-10-CM

## 2023-12-18 DIAGNOSIS — Z9889 Other specified postprocedural states: Secondary | ICD-10-CM

## 2023-12-18 MED ORDER — ELIQUIS 5 MG PO TABS
5.0000 mg | ORAL_TABLET | Freq: Two times a day (BID) | ORAL | 0 refills | Status: DC
  Filled 2023-12-18: qty 60, 30d supply, fill #0

## 2023-12-18 MED ORDER — BRILINTA 90 MG PO TABS
90.0000 mg | ORAL_TABLET | Freq: Two times a day (BID) | ORAL | 0 refills | Status: DC
  Filled 2023-12-18 (×2): qty 60, 30d supply, fill #0

## 2023-12-18 NOTE — Telephone Encounter (Signed)
 Pharmacy Patient Advocate Encounter  Insurance verification completed.   The patient is insured through  [facility]    Ran test claim for Eliquis . Currently a quantity of 30 is a 30 day supply and the co-pay is $10 .   This test claim was processed through [facility]- copay amounts may vary at other pharmacies due to pharmacy/plan contracts, or as the patient moves through the different stages of their insurance plan.

## 2023-12-18 NOTE — Telephone Encounter (Signed)
 Please call patient to provide updates on follow-up recommendations.  After her conversation with our clinical pharmacy team and medications were re-ordered for Eliquis , and found that the Brilinta  is too high cost still. Next step is she will need to return to Vascular specialist to determine any further blood thinner or anti platelet therapy.  She last saw them in January 2024 through [facility]  [address]  [place], [place] [address]  Phone: [phone]  Fax: [phone]   She can contact them and return, or we can refer her back there, or we can refer her to local Vascular specialist in [place].  Let me know - but we need her to return to Vascular due she may be missing further imaging and testing that was recommended back in 2024.  Morgan Friend, DO [facility] 12/18/2023, 4:57 PM

## 2023-12-18 NOTE — Patient Instructions (Signed)
 Goals Addressed             This Visit's Progress    Pharmacy Goals       As we discussed, please follow up with [facility] at [phone] - Option 2, then Option 2 for assistance with adding the copay savings cards for both Brilinta  and Eliquis    Brilinta  Savings Card:   BIN # [id number] PCN# [id number] GRP# [id number] ID# [id number]   If you need to download a new card, you can do so at: [email or web address]     Eliquis  Savings Card:   BIN: [id number] PCN: [id number] GRP: [id number] ID: [id number]   If you need to download a new card, you can do so at:  [email or web address]  Regarding the 30 day supply prescriptions that we discussed, please follow up with:  [facility] Building [address] [place], [place] [address] [phone]  Thank you!  Arthur Lash, PharmD, Becky Bowels, CPP Clinical Pharmacist [facility] [phone]

## 2023-12-18 NOTE — Progress Notes (Signed)
   12/18/2023 Name: Morgan Wright MRN: [id number] DOB: [birth year]  Chief Complaint  Patient presents with   Medication Assistance    Morgan Wright is a 58 y.o. year old female who presented for a telephone visit.   They were referred to the pharmacist by their PCP for assistance in managing medication access.   Per referral from PCP, patient is to be on both antiplatelet therapy + anticoagulation, but patient has been unable to access these due to cost barrier  Reach patient by telephone today, but conversation is limited/unable to review medications as patient is not home (at work)   Subjective:  Care Team: Primary Care Provider: Raina Bunting, DO ; Next Scheduled Visit: 04/20/2024 Cardiologist: Gollan, Timothy J, MD; Next Scheduled Visit: 03/08/2024   Medication Access/Adherence  Current Pharmacy:  [email or web address] - [place], [place] - [address] [place] [address] Phone: [phone] Fax: [phone]  [facility] [address] [place] - [address] [place] [address] Phone: [phone] Fax: [phone]   Today patient shares that she has been out of her Eliquis  since November and Brilinta  since March due to cost. Denies barriers to obtaining her other medications  Reports required to use [facility] for her insurance plan. Reports has previously downloaded manufacturer savings cards for Eliquis  and Brilinta , but was unable to upload these to [facility] website    Assessment/Plan:   Outreach to [facility] today [phone]) on behalf of patient and speak with representative Samika.  - Download copay savings cards for both Eliquis  and Brilinta  from manufacturer websites and attempt to provide these to representative, but am advised that these must be provided by the patient. Advises that if patient is having difficulty with  providing these cards through the website, patient to contact [facility] at [phone] - Option 2, then Option 2 for assistance with adding the copay savings cards for both Brilinta  and Eliquis   - Follow up with patient to provide this update. States that she will contact [facility] - Send patient MyChart message with this information and copay savings card information - Note patient will need a renewal of Eliquis  prescription sent to [facility] as current prescription is expired  Will collaborate with PCP  Also collaborate with CPhT Trenton Frock to run test claims for patient - Test claim for Eliquis  goes through patient's plan + copay savings card for $10 for 1 month supply - Unable to complete test claim for Brilinta  today as processor for this savings card is down  Collaborate with patient. Requests to have a 30 day supply of Eliquis  and Brilinta  prescriptions sent to [facility] to pick up while working on resolving uploading savings cards/obtaining medications through [facility]  Will collaborate with PCP   Follow Up Plan: Clinical Pharmacist will follow up with patient by telephone on 01/04/2024 at 9:30 AM   Arthur Lash, PharmD, Becky Bowels, CPP Clinical Pharmacist [facility] [phone]

## 2023-12-21 NOTE — Addendum Note (Signed)
 Addended by: Raina Bunting on: 12/21/2023 11:54 AM   Modules accepted: Orders

## 2023-12-21 NOTE — Telephone Encounter (Signed)
 Referral sent to [facility] locally.  Domingo Friend, DO [facility] 12/21/2023, 11:54 AM

## 2023-12-24 ENCOUNTER — Other Ambulatory Visit: Payer: Self-pay

## 2023-12-24 ENCOUNTER — Other Ambulatory Visit: Payer: Self-pay | Admitting: Family Medicine

## 2023-12-24 DIAGNOSIS — I2510 Atherosclerotic heart disease of native coronary artery without angina pectoris: Secondary | ICD-10-CM

## 2023-12-24 MED ORDER — ELIQUIS 5 MG PO TABS
5.0000 mg | ORAL_TABLET | Freq: Two times a day (BID) | ORAL | 1 refills | Status: DC
Start: 2023-12-24 — End: 2024-05-24

## 2024-01-04 ENCOUNTER — Other Ambulatory Visit

## 2024-01-04 ENCOUNTER — Telehealth: Payer: Self-pay | Admitting: Pharmacist

## 2024-01-04 NOTE — Telephone Encounter (Signed)
   Outreach Note  01/04/2024 Name: Morgan Wright MRN: [id number] DOB: 1966-07-29  Referred by: Raina Bunting, DO  Was unable to reach patient via telephone today and have left HIPAA compliant voicemail asking patient to return my call.   Follow Up Plan: Will collaborate with Care Guide to outreach to schedule follow up with me  Arthur Lash, PharmD, [facility] Clinical Pharmacist [facility] [phone]

## 2024-01-06 ENCOUNTER — Telehealth: Payer: Self-pay

## 2024-01-06 NOTE — Progress Notes (Signed)
 Complex Care Management Care Guide Note  01/06/2024 Name: Morgan Wright MRN: [id number] DOB: 03/26/1966  Morgan Wright is a 58 y.o. year old female who is a primary care patient of Raina Bunting, DO and is actively engaged with the care management team. I reached out to Chantell A Alva Jewels by phone today to assist with re-scheduling  with the Pharmacist.  Follow up plan: Unsuccessful telephone outreach attempt made. A HIPAA compliant phone message was left for the patient providing contact information and requesting a return call.  Lenton Rail , RMA     [facility]  [facility] Guide  Direct Dial: [phone]  Website: [email or web address]

## 2024-01-14 DIAGNOSIS — M65331 Trigger finger, right middle finger: Secondary | ICD-10-CM | POA: Diagnosis not present

## 2024-01-14 DIAGNOSIS — G5601 Carpal tunnel syndrome, right upper limb: Secondary | ICD-10-CM | POA: Diagnosis not present

## 2024-02-23 ENCOUNTER — Other Ambulatory Visit (INDEPENDENT_AMBULATORY_CARE_PROVIDER_SITE_OTHER): Payer: Self-pay | Admitting: Nurse Practitioner

## 2024-02-23 DIAGNOSIS — Z9889 Other specified postprocedural states: Secondary | ICD-10-CM

## 2024-02-23 DIAGNOSIS — I701 Atherosclerosis of renal artery: Secondary | ICD-10-CM

## 2024-02-29 ENCOUNTER — Encounter (INDEPENDENT_AMBULATORY_CARE_PROVIDER_SITE_OTHER): Payer: Self-pay | Admitting: Nurse Practitioner

## 2024-02-29 ENCOUNTER — Encounter (INDEPENDENT_AMBULATORY_CARE_PROVIDER_SITE_OTHER): Payer: Self-pay

## 2024-03-04 ENCOUNTER — Other Ambulatory Visit

## 2024-03-07 NOTE — Progress Notes (Unsigned)
 Cardiology Office Note  Date:  03/08/2024   ID:  Morgan Wright, Morgan Wright, MRN [id number]  PCP:  Morgan Wright   Chief Complaint  Patient presents with   New Patient (Initial Visit)    Establish care for CAD/s/p CABG x 3 at [facility] in 2014; former Dr. Bosie patient at [facility]. Patient c/o shortness of breath with walking a long distance but otherwise no difficulties.     HPI:  Morgan Wright is a 58 year old with past medical history of Diabetes type 2 Hypertension Renal artery stenosis 9/23 Cad, hx of CABG July 2010  Smoking history 1 to 2 packs/day Hyperlipidemia Who presents to establish care for her coronary artery disease, PAD  Discussed prior cardiovascular history Renal artery stenosis , s/p PCI on left, on aspirin  Brilinta  100% occlusion on the right  Prior imaging reviewed CT ABD/pelvis 10/23  Hypoenhancement of the right kidney ostial stenosis of the right renal artery.  2.  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  3.  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  4.   Multiple areas of short segment moderate to severe stenosis of the infrarenal abdominal aorta, common iliac, and external iliac arteries bilaterally.   Cardiac catheterization on March 01, 2009 revealed 50% left main, 50% LAD, 80% mid left circumflex, 75% posterior RCA and a 95% mid RCA stenosis. She underwent coronary artery bypass grafting x3 at [facility] with a left internal mammary to the LAD, saphenous vein graft to the OM1 and a saphenous vein graft to the RCA.   Lab work reviewed A1C 5.9 Total chol 146, LDL 84  Reports having hip pain on ambulation Continues to smoke 1 to 2 packs/day  EKG personally reviewed by myself on todays visit EKG Interpretation Date/Time:  Tuesday March 08 2024 15:38:57 EDT Ventricular Rate:  67 PR Interval:  152 QRS Duration:  82 QT Interval:  388 QTC Calculation: 409 R  Axis:   22  Text Interpretation: Normal sinus rhythm ST & T wave abnormality, consider inferolateral ischemia No previous ECGs available Confirmed by Perla Lye [phone]) on 03/08/2024 3:45:24 PM    PMH:   has a past medical history of Acquired hypothyroidism (11/26/2018), Coronary artery disease, Coronary artery disease involving native coronary artery of native heart without angina pectoris (11/26/2018), Essential hypertension (11/26/2018), Heart murmur, Hepatitis, History of hepatitis C (11/26/2018), Mixed hyperlipidemia (11/26/2018), and 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  PSH:    Past Surgical History:  Procedure Laterality Date   CARDIAC SURGERY     CESAREAN SECTION     x 3 - 1984-1994   MICROLARYNGOSCOPY W/VOCAL CORD INJECTION Bilateral 01/28/2019   Procedure: MICROLARYNGOSCOPY WITH VOCAL CORD EXCISION;  Surgeon: Herminio Miu, MD;  Location: [facility] [place] [facility];  Service: ENT;  Laterality: Bilateral;   TONSILLECTOMY      Current Outpatient Medications  Medication Sig Dispense Refill   amLODipine  (NORVASC ) 10 MG tablet Take 1 tablet (10 mg total) by mouth daily. 90 tablet 1   aspirin  EC 81 MG tablet Take 1 tablet (81 mg total) by mouth daily. Swallow whole. 30 tablet 12   BRILINTA  90 MG TABS tablet Take 1 tablet (90 mg total) by mouth 2 (two) times daily. 60 tablet 0   carvedilol  (COREG ) 25 MG tablet Take 1 tablet (25 mg total) by mouth 2 (two) times daily. 180 tablet 1   hydrALAZINE  (APRESOLINE ) 25 MG tablet Take 1 tablet (25 mg total) by  mouth 2 (two) times daily as needed (if BP >160/100). 60 tablet 2   levothyroxine  (SYNTHROID ) 50 MCG tablet TAKE 1 TABLET BY MOUTH EVERY OTHER DAY, ALTERNATING WITH OTHER DOSE OF 75 MCG EVERY OTHER DAY. 45 tablet 1   levothyroxine  (SYNTHROID ) 75 MCG tablet TAKE 1 TABLET BY MOUTH EVERY OTHER DAY AND ALTERNATE WITH 50 MCG TABLET. 45 tablet 1   metFORMIN  (GLUCOPHAGE -XR) 750 MG 24 hr tablet Take 2 tablets by mouth daily with breakfast. 180 tablet 3   NICOTINE  STEP 1  21 MG/24HR patch 21 mg daily.     olmesartan  (BENICAR ) 40 MG tablet Take 1 tablet (40 mg total) by mouth daily. 90 tablet 1   rosuvastatin  (CRESTOR ) 40 MG tablet Take 1 tablet (40 mg total) by mouth at bedtime. 90 tablet 1   albuterol  (VENTOLIN  HFA) 108 (90 Base) MCG/ACT inhaler Inhale 2 puffs into the lungs every 4 (four) hours as needed. (Patient not taking: Reported on 03/08/2024) 18 g 0   ELIQUIS  5 MG TABS tablet Take 1 tablet (5 mg total) by mouth 2 (two) times daily. (Patient not taking: Reported on 03/08/2024) 180 tablet 1   Spacer/Aero-Holding Chambers (AEROCHAMBER MV) inhaler Use as instructed (Patient not taking: Reported on 03/08/2024) 1 each 2   No current facility-administered medications for this visit.    Allergies:   Patient has no known allergies.   Social History:  The patient  reports that she has been smoking cigarettes. She has a 30 pack-year smoking history. She has never used smokeless tobacco. She reports that she does not drink alcohol and does not use drugs.   Family History:   family history includes Anuerysm (age of onset: 91) in her father; Heart disease in her mother; Lymphoma in her sister.    Review of Systems: ROS   PHYSICAL EXAM: VS:  BP (!) 160/60 (BP Location: Right Arm, Patient Position: Sitting, Cuff Size: Normal)   Pulse 67   Ht 5' 1 (1.549 m)   Wt 178 lb (80.7 kg)   SpO2 95%   BMI 33.63 kg/m  , BMI Body mass index is 33.63 kg/m. GEN: Well nourished, well developed, in no acute distress HEENT: normal Neck: no JVD, 2+ carotid bruit on left, no masses Cardiac: RRR; no murmurs, rubs, or gallops,no edema  Respiratory:  clear to auscultation bilaterally, normal work of breathing GI: soft, nontender, nondistended, + BS Morgan: no deformity or atrophy Skin: warm and dry, no rash Neuro:  Strength and sensation are intact Psych: euthymic mood, full affect    Recent Labs: 12/09/2023: ALT 19; BUN 14; Creat 0.94; Potassium 4.2; Sodium 138; TSH 10.68     Lipid Panel Lab Results  Component Value Date   CHOL 146 02/05/2022   HDL 34 (L) 02/05/2022   LDLCALC 84 02/05/2022   TRIG 185 (H) 02/05/2022      Wt Readings from Last 3 Encounters:  03/08/24 178 lb (80.7 kg)  12/09/23 175 lb 2 oz (79.4 kg)  09/26/22 166 lb (75.3 kg)      ASSESSMENT AND PLAN:  Problem List Items Addressed This Visit       Cardiology Problems   Atherosclerotic renal artery stenosis (HCC)   Relevant Orders   EKG 12-Lead (Completed)   Essential hypertension   Relevant Orders   EKG 12-Lead (Completed)   CAD (coronary artery disease) - Primary   Relevant Orders   EKG 12-Lead (Completed)   Hyperlipidemia, mixed     Other   Type 2 diabetes  mellitus with other specified complication (HCC)   Tobacco abuse   S/P CABG x 3   Relevant Orders   EKG 12-Lead (Completed)   Morbid obesity (HCC)   Coronary artery disease with stable angina Remote history of CABG 2010 Denies chest pain concerning for angina Smoking cessation recommended Continue Crestor  40 daily  PAD Occlusion to right renal artery, severe aorta and iliac disease on prior CT scan 2023 Having hip pain on exertion concerning for claudication symptoms Recommended aorta and iliac artery Dopplers Also ordered carotid ultrasound for bruit on left - She reports that she is not taking Eliquis , unclear where this was initiated, she is unable to afford it - Brilinta  down to 60 mg twice daily continue aspirin  81 daily  Hyperlipidemia Continue Crestor  40  Essential hypertension Continue Benicar  40 daily, Coreg  25 twice daily, amlodipine  10 daily Increase hydralazine  up to 50 mg 3 times daily Recommend she monitor blood pressure at home and call us  with numbers  Smoking We have encouraged her to continue to work on weaning her cigarettes and smoking cessation. She will continue to work on this and does not want any assistance with chantix.  Chantix did not work in the past Requesting nicotine   patches, prescription placed  Morgan. Pagnotta was seen in consultation for Dr. Edman and will be referred back to his office for ongoing care of the issues detailed above  Signed, Tim Lucifer Soja, M.D., Ph.D. [facility] [place], [place] [phone]

## 2024-03-08 ENCOUNTER — Ambulatory Visit: Payer: Self-pay | Admitting: Internal Medicine

## 2024-03-08 ENCOUNTER — Other Ambulatory Visit: Payer: Self-pay | Admitting: Cardiovascular Disease

## 2024-03-08 ENCOUNTER — Encounter: Payer: Self-pay | Admitting: Cardiovascular Disease

## 2024-03-08 ENCOUNTER — Ambulatory Visit: Attending: Cardiovascular Disease | Admitting: Cardiovascular Disease

## 2024-03-08 VITALS — BP 160/60 | HR 67 | Ht 61.0 in | Wt 178.0 lb

## 2024-03-08 DIAGNOSIS — I10 Essential (primary) hypertension: Secondary | ICD-10-CM

## 2024-03-08 DIAGNOSIS — I25118 Atherosclerotic heart disease of native coronary artery with other forms of angina pectoris: Secondary | ICD-10-CM

## 2024-03-08 DIAGNOSIS — E782 Mixed hyperlipidemia: Secondary | ICD-10-CM

## 2024-03-08 DIAGNOSIS — R0989 Other specified symptoms and signs involving the circulatory and respiratory systems: Secondary | ICD-10-CM

## 2024-03-08 DIAGNOSIS — I701 Atherosclerosis of renal artery: Secondary | ICD-10-CM | POA: Diagnosis not present

## 2024-03-08 DIAGNOSIS — Z951 Presence of aortocoronary bypass graft: Secondary | ICD-10-CM

## 2024-03-08 DIAGNOSIS — Z720 Tobacco use: Secondary | ICD-10-CM

## 2024-03-08 DIAGNOSIS — I739 Peripheral vascular disease, unspecified: Secondary | ICD-10-CM

## 2024-03-08 DIAGNOSIS — E1169 Type 2 diabetes mellitus with other specified complication: Secondary | ICD-10-CM

## 2024-03-08 DIAGNOSIS — I2510 Atherosclerotic heart disease of native coronary artery without angina pectoris: Secondary | ICD-10-CM

## 2024-03-08 LAB — COMPREHENSIVE METABOLIC PANEL WITH GFR
AG Ratio: 1.7 (calc) (ref 1.0–2.5)
ALT: 19 U/L (ref 6–29)
AST: 13 U/L (ref 10–35)
Albumin: 4.5 g/dL (ref 3.6–5.1)
Alkaline phosphatase (APISO): 60 U/L (ref 37–153)
BUN: 14 mg/dL (ref 7–25)
CO2: 25 mmol/L (ref 20–32)
Calcium: 9.8 mg/dL (ref 8.6–10.4)
Chloride: 102 mmol/L (ref 98–110)
Creat: 0.94 mg/dL (ref 0.50–1.03)
Globulin: 2.7 g/dL (ref 1.9–3.7)
Glucose, Bld: 86 mg/dL (ref 65–99)
Potassium: 4.2 mmol/L (ref 3.5–5.3)
Sodium: 138 mmol/L (ref 135–146)
Total Bilirubin: 0.4 mg/dL (ref 0.2–1.2)
Total Protein: 7.2 g/dL (ref 6.1–8.1)
eGFR: 71 mL/min/1.73m2 (ref >=60)

## 2024-03-08 LAB — EXTRA SPECIMEN

## 2024-03-08 LAB — TSH: TSH: 10.68 m[IU]/L — ABNORMAL HIGH (ref 0.40–4.50)

## 2024-03-08 LAB — T4, FREE: Free T4: 1.2 ng/dL (ref 0.8–1.8)

## 2024-03-08 MED ORDER — NICOTINE STEP 1 21 MG/24HR TD PT24: 21.0000 mg | MEDICATED_PATCH | Freq: Every day | TRANSDERMAL | 3 refills | Status: DC

## 2024-03-08 MED ORDER — HYDRALAZINE HCL 50 MG PO TABS: 50.0000 mg | ORAL_TABLET | Freq: Three times a day (TID) | ORAL | 3 refills | Status: AC

## 2024-03-08 MED ORDER — TICAGRELOR 60 MG PO TABS: 60.0000 mg | ORAL_TABLET | Freq: Two times a day (BID) | ORAL | 3 refills | Status: DC

## 2024-03-08 NOTE — Patient Instructions (Addendum)
 Medication Instructions:   Please increase the hydralazine  up to 50 mg three times a day  Please decrease brilinta  from 90 mg down to 60 mg twice a day  Monitor blood pressure  If you need a refill on your cardiac medications before your next appointment, please call your pharmacy.   Lab work: No new labs needed  Testing/Procedures: Your physician has requested that you have a carotid duplex. This test is an ultrasound of the carotid arteries in your neck. It looks at blood flow through these arteries that supply the brain with blood.   Allow one hour for this exam.  There are no restrictions or special instructions.  This will take place at [address] [facility] [address]) [address], [place] [address]  Please note: We ask at that you not bring children with you during ultrasound (echo/ vascular) testing. Due to room size and safety concerns, children are not allowed in the ultrasound rooms during exams. Our front office staff cannot provide observation of children in our lobby area while testing is being conducted. An adult accompanying a patient to their appointment will only be allowed in the ultrasound room at the discretion of the ultrasound technician under special circumstances. We apologize for any inconvenience.   Your physician has recommended that you have an AORTA/ILIAC Duplex. This is a noninvasive diagnostic test that uses ultrasound technology to look at the aorta and iliac arteries to detect any restriction to blood flow to the buttocks, groin, legs or feet.  No food after 11PM the night before.  Water is OK. (Don't drink liquids if you have been instructed not to for ANOTHER test). Avoid foods that produce bowel gas, for 24 hours prior to exam (see below). No breakfast, no chewing gum, no smoking or carbonated beverages. Patient may take morning medications with water. Come in for test at least 15 minutes early to register. This will take approximately 60  minutes This will take place at [address] [facility] [address]) [address], [place] [address]  Please note: We ask at that you not bring children with you during ultrasound (echo/ vascular) testing. Due to room size and safety concerns, children are not allowed in the ultrasound rooms during exams. Our front office staff cannot provide observation of children in our lobby area while testing is being conducted. An adult accompanying a patient to their appointment will only be allowed in the ultrasound room at the discretion of the ultrasound technician under special circumstances. We apologize for any inconvenience.   Follow-Up: At [facility], you and your health needs are our priority.  As part of our continuing mission to provide you with exceptional heart care, we have created designated Provider Care Teams.  These Care Teams include your primary Cardiologist (physician) and Advanced Practice Providers (APPs -  Physician Assistants and Nurse Practitioners) who all work together to provide you with the care you need, when you need it.  You will need a follow up appointment in 6 months  Providers on your designated Care Team:   Lonni Meager, NP Bernardino Bring, PA-C Cadence [place], [place]  COVID-19 Vaccine Information can be found at: [email or web address] For questions related to vaccine distribution or appointments, please email [email or web address] or call [phone].

## 2024-03-09 ENCOUNTER — Other Ambulatory Visit: Payer: Self-pay | Admitting: Family Medicine

## 2024-03-09 DIAGNOSIS — E039 Hypothyroidism, unspecified: Secondary | ICD-10-CM

## 2024-03-09 NOTE — Telephone Encounter (Signed)
-----   Message from [facility] sent at 03/08/2024  9:42 PM EDT ----- Her TSH is elevated. Has she been alternating her 50 mcg and 75 mcg as prescribed? ----- Message ----- From: Interface, [facility] Lab Results In Sent: 03/08/2024   9:21 AM EDT To: Marsa JINNY Officer, DO

## 2024-03-09 NOTE — Telephone Encounter (Signed)
 Spoke with patient.  She says she is alternating 50 and 75 but she is taking it with other medications and sometimes food.  Recommended she take it alone about 30 minutes before taking anything else or eating.

## 2024-03-11 NOTE — Telephone Encounter (Signed)
 Too soon for refill, refilled 12/09/23 for 90 and 1 refill.  Requested Prescriptions  Pending Prescriptions Disp Refills   levothyroxine  (SYNTHROID ) 75 MCG tablet [Pharmacy Med Name: Levothyroxine  Sodium 75 MCG Oral Tablet] 45 tablet 0    Sig: TAKE 1 TABLET BY MOUTH EVERY OTHER DAY AND ALTERNATE WITH 50 MCG TABLET     Endocrinology:  Hypothyroid Agents Failed - 03/11/2024 10:40 AM      Failed - TSH in normal range and within 360 days    TSH  Date Value Ref Range Status  12/09/2023 10.68 (H) 0.40 - 4.50 mIU/L Final         Passed - Valid encounter within last 12 months    Recent Outpatient Visits           3 months ago Type 2 diabetes mellitus with other specified complication, without long-term current use of insulin [facility])   [place] [facility] [place], Marsa PARAS, [place]

## 2024-03-16 DIAGNOSIS — R200 Anesthesia of skin: Secondary | ICD-10-CM | POA: Diagnosis not present

## 2024-03-28 ENCOUNTER — Encounter

## 2024-03-30 ENCOUNTER — Telehealth: Payer: Self-pay | Admitting: Emergency Medicine

## 2024-03-30 ENCOUNTER — Ambulatory Visit: Attending: Cardiovascular Disease

## 2024-03-30 ENCOUNTER — Ambulatory Visit (INDEPENDENT_AMBULATORY_CARE_PROVIDER_SITE_OTHER)

## 2024-03-30 DIAGNOSIS — R0989 Other specified symptoms and signs involving the circulatory and respiratory systems: Secondary | ICD-10-CM | POA: Diagnosis not present

## 2024-03-30 DIAGNOSIS — Z79899 Other long term (current) drug therapy: Secondary | ICD-10-CM

## 2024-03-30 DIAGNOSIS — I739 Peripheral vascular disease, unspecified: Secondary | ICD-10-CM | POA: Diagnosis not present

## 2024-03-30 DIAGNOSIS — I700 Atherosclerosis of aorta: Secondary | ICD-10-CM

## 2024-03-30 DIAGNOSIS — I6529 Occlusion and stenosis of unspecified carotid artery: Secondary | ICD-10-CM

## 2024-03-30 NOTE — Telephone Encounter (Signed)
 Called patient and notified her of the following from Dr. Gollan.  can we order a CTA neck, she will probably need a updated BMP first   Patient verbalizes understanding. Orders placed for CTA neck and BMP.

## 2024-04-01 ENCOUNTER — Other Ambulatory Visit (INDEPENDENT_AMBULATORY_CARE_PROVIDER_SITE_OTHER): Payer: Self-pay

## 2024-04-01 ENCOUNTER — Ambulatory Visit (INDEPENDENT_AMBULATORY_CARE_PROVIDER_SITE_OTHER): Payer: Self-pay | Admitting: Nurse Practitioner

## 2024-04-01 ENCOUNTER — Ambulatory Visit: Payer: Self-pay | Admitting: Cardiovascular Disease

## 2024-04-01 VITALS — BP 152/68 | HR 58 | Ht 61.0 in | Wt 176.0 lb

## 2024-04-01 DIAGNOSIS — I6529 Occlusion and stenosis of unspecified carotid artery: Secondary | ICD-10-CM

## 2024-04-01 DIAGNOSIS — I739 Peripheral vascular disease, unspecified: Secondary | ICD-10-CM

## 2024-04-01 DIAGNOSIS — Z9889 Other specified postprocedural states: Secondary | ICD-10-CM

## 2024-04-01 DIAGNOSIS — I701 Atherosclerosis of renal artery: Secondary | ICD-10-CM

## 2024-04-01 DIAGNOSIS — I10 Essential (primary) hypertension: Secondary | ICD-10-CM

## 2024-04-01 DIAGNOSIS — I6523 Occlusion and stenosis of bilateral carotid arteries: Secondary | ICD-10-CM

## 2024-04-01 NOTE — Telephone Encounter (Signed)
 Pt returning nurse call in regards to results please c/b after 10.

## 2024-04-05 ENCOUNTER — Encounter (INDEPENDENT_AMBULATORY_CARE_PROVIDER_SITE_OTHER): Payer: Self-pay | Admitting: Nurse Practitioner

## 2024-04-05 NOTE — Progress Notes (Signed)
 Subjective:    Patient ID: Morgan Wright, female    DOB: 05/17/1966, 58 y.o.   MRN: [id number] Chief Complaint  Patient presents with   Establish Care    The patient presents today as a referral from Dr. Edman in regards to renal artery stenosis.  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  In 2023 she underwent left renal artery stenting at [facility].  Since that time, it has not been followed.  She denies any significant issues with her blood pressure.  Today noninvasive studies show a 1 to 59% stenosis of the left renal artery however velocities are within the lower end of this range.  She has a widely patent stent.  She does endorse having claudication-like symptoms.  She notes that she discussed this with her cardiologist and she has recently had ultrasounds.  That they have not yet been read.  She notes that she also underwent carotid duplex due to having a bruit heard during her physical exam and she notes that is significant and they are going to be requiring a CT scan.  Addendum: The studies were not available during the time of the visit however in review she in a 99% stenosis of her right ICA.  She is scheduled for a CT angio of the neck on 04/11/2024.  Will try to have her seen shortly after.  In addition to her claudication symptoms aortoiliac duplex revealed greater than 50% stenosis in her bilateral iliac vessels.    Review of Systems  Cardiovascular:        Claudication  All other systems reviewed and are negative.      Objective:   Physical Exam Vitals reviewed.  Neck:     Vascular: Carotid bruit present.  Cardiovascular:     Rate and Rhythm: Normal rate.  Pulmonary:     Effort: Pulmonary effort is normal.  Skin:    General: Skin is warm and dry.  Neurological:     Mental Status: She is alert and oriented to person, place, and time.     Gait: Gait abnormal.  Psychiatric:        Mood and Affect: Mood normal.         Behavior: Behavior normal.        Thought Content: Thought content normal.        Judgment: Judgment normal.     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    Social History   Socioeconomic History   Marital status: Single    Spouse name: Not on file   Number of children: Not on file   Years of education: High School   Highest education level: High school graduate  Occupational History   Occupation: Shipping/Receiving    Comment: [employer] [place]  Tobacco Use   Smoking status: Every Day    Current packs/day: 0.75    Average packs/day: 0.8 packs/day for 40.0 years (30.0 ttl pk-yrs)  Types: Cigarettes   Smokeless tobacco: Never  Vaping Use   Vaping status: Never Used  Substance and Sexual Activity   Alcohol use: No   Drug use: Never   Sexual activity: Not on file  Other Topics Concern   Not on file  Social History Narrative   Not on file   Social Drivers of Health   Financial Resource Strain: Low Risk  (01/14/2024)   Received from [facility]   Overall Financial Resource Strain (CARDIA)    Difficulty of Paying Living Expenses: Not hard at all  Food Insecurity: No Food Insecurity (01/14/2024)   [facility] from [facility]   Hunger Vital Sign    Within the past 12 months, you worried that your food would run out before you got the money to buy more.: Never true    Within the past 12 months, the food you bought just didn't last and you didn't have money to get more.: Never true  Transportation Needs: No Transportation  Needs (01/14/2024)   Received from [facility] - Transportation    In the past 12 months, has lack of transportation kept you from medical appointments or from getting medications?: No    Lack of Transportation (Non-Medical): No  Physical Activity: Not on file  Stress: Not on file  Social Connections: Not on file  Intimate Partner Violence: Not on file    Past Surgical History:  Procedure Laterality Date   CARDIAC SURGERY     CESAREAN SECTION     x 3 - 1984-1994   MICROLARYNGOSCOPY W/VOCAL CORD INJECTION Bilateral 01/28/2019   Procedure: MICROLARYNGOSCOPY WITH VOCAL CORD EXCISION;  Surgeon: Herminio Miu, MD;  Location: [facility];  Service: ENT;  Laterality: Bilateral;   TONSILLECTOMY      Family History  Problem Relation Age of Onset   Heart disease Mother    Anuerysm Father 4   Lymphoma Sister        burkit    No Known Allergies     Latest Ref Rng & Units 06/23/2022    3:24 PM 06/09/2022    2:56 PM 02/05/2022    8:07 AM  CBC  WBC 3.8 - 10.8 Thousand/uL 7.0  14.0  13.8   Hemoglobin 11.7 - 15.5 g/dL 86.5  86.0  84.9   Hematocrit 35.0 - 45.0 % 39.6  40.6  44.3   Platelets 140 - 400 Thousand/uL 177  322  266       CMP     Component Value Date/Time   NA 138 12/09/2023 1530   NA 133 (L) 06/15/2012 0915   K 4.2 12/09/2023 1530   K 4.3 06/15/2012 0915   CL 102 12/09/2023 1530   CL 98 06/15/2012 0915   CO2 25 12/09/2023 1530   CO2 26 06/15/2012 0915   GLUCOSE 86 12/09/2023 1530   GLUCOSE 166 (H) 06/15/2012 0915   BUN 14 12/09/2023 1530   BUN 8 06/15/2012 0915   CREATININE 0.94 12/09/2023 1530   CALCIUM  9.8 12/09/2023 1530   CALCIUM  9.1 06/15/2012 0915   PROT 7.2 12/09/2023 1530   PROT 7.1 06/15/2012 0915   ALBUMIN 3.9 10/29/2021 1217   ALBUMIN 3.5 06/15/2012 0915   AST 13 12/09/2023 1530   AST 14 (L) 06/15/2012 0915   ALT 19 12/09/2023 1530   ALT 25 06/15/2012 0915   ALKPHOS 56 10/29/2021 1217   ALKPHOS 71  06/15/2012 0915   BILITOT 0.4 12/09/2023 1530  BILITOT 0.5 06/15/2012 0915   EGFR 71 12/09/2023 1530   GFRNONAA >60 10/29/2021 1217   GFRNONAA 100 03/07/2019 0809     No results found.     Assessment & Plan:   1. History of stent insertion of renal artery (Primary) Patient studies today reveal a widely patent left renal artery stent.  The patient's blood pressure is acceptable at this time.  Based on this we will not plan any further intervention but we will plan on having the patient follow-up in 1 year with noninvasive studies.  She is advised to follow-up sooner if she begins to have issues with her blood pressure control.  2. Essential hypertension Continue antihypertensive medications as already ordered, these medications have been reviewed and there are no changes at this time.  3. Bilateral carotid artery stenosis At the time of her visit her studies were not available but she did note that she would be having an upcoming CT scan based upon the result of her carotid duplex.  It appears that she has velocities that would be consistent with a greater than 80% stenosis of her right ICA.  She has an upcoming CT scan.  Will plan on having her follow-up after the CT scan to discuss intervention.  4. PAD (peripheral artery disease) (HCC) At the time of her visit her studies were not available to me, however it does appear that she has significant iliac disease.  Given her significant carotid stenosis this can be addressed after her carotid disease.   Current Outpatient Medications on File Prior to Visit  Medication Sig Dispense Refill   albuterol  (VENTOLIN  HFA) 108 (90 Base) MCG/ACT inhaler Inhale 2 puffs into the lungs every 4 (four) hours as needed. (Patient not taking: Reported on 03/08/2024) 18 g 0   amLODipine  (NORVASC ) 10 MG tablet Take 1 tablet (10 mg total) by mouth daily. 90 tablet 1   aspirin  EC 81 MG tablet Take 1 tablet (81 mg total) by mouth daily. Swallow whole. 30 tablet  12   carvedilol  (COREG ) 25 MG tablet Take 1 tablet (25 mg total) by mouth 2 (two) times daily. 180 tablet 1   ELIQUIS  5 MG TABS tablet Take 1 tablet (5 mg total) by mouth 2 (two) times daily. (Patient not taking: Reported on 03/08/2024) 180 tablet 1   hydrALAZINE  (APRESOLINE ) 50 MG tablet Take 1 tablet (50 mg total) by mouth 3 (three) times daily. 270 tablet 3   levothyroxine  (SYNTHROID ) 50 MCG tablet TAKE 1 TABLET BY MOUTH EVERY OTHER DAY, ALTERNATING WITH OTHER DOSE OF 75 MCG EVERY OTHER DAY. 45 tablet 1   levothyroxine  (SYNTHROID ) 75 MCG tablet TAKE 1 TABLET BY MOUTH EVERY OTHER DAY AND ALTERNATE WITH 50 MCG TABLET. 45 tablet 1   metFORMIN  (GLUCOPHAGE -XR) 750 MG 24 hr tablet Take 2 tablets by mouth daily with breakfast. 180 tablet 3   NICOTINE  STEP 1 21 MG/24HR patch Place 1 patch (21 mg total) onto the skin daily. 28 patch 3   olmesartan  (BENICAR ) 40 MG tablet Take 1 tablet (40 mg total) by mouth daily. 90 tablet 1   rosuvastatin  (CRESTOR ) 40 MG tablet Take 1 tablet (40 mg total) by mouth at bedtime. 90 tablet 1   Spacer/Aero-Holding Chambers (AEROCHAMBER MV) inhaler Use as instructed (Patient not taking: Reported on 03/08/2024) 1 each 2   ticagrelor  (BRILINTA ) 60 MG TABS tablet Take 1 tablet by mouth twice daily. 180 tablet 3   No current facility-administered medications on file prior to visit.  There are no Patient Instructions on file for this visit. No follow-ups on file.   Lacy Taglieri E Dimitry Holsworth, NP

## 2024-04-06 ENCOUNTER — Other Ambulatory Visit: Payer: Self-pay | Admitting: Family Medicine

## 2024-04-06 DIAGNOSIS — Z79899 Other long term (current) drug therapy: Secondary | ICD-10-CM | POA: Diagnosis not present

## 2024-04-06 DIAGNOSIS — I10 Essential (primary) hypertension: Secondary | ICD-10-CM

## 2024-04-07 LAB — BASIC METABOLIC PANEL WITH GFR
BUN/Creatinine Ratio: 11 (ref 9–23)
BUN: 13 mg/dL (ref 6–24)
CO2: 20 mmol/L (ref 20–29)
Calcium: 10.1 mg/dL (ref 8.7–10.2)
Chloride: 102 mmol/L (ref 96–106)
Creatinine, Ser: 1.14 mg/dL — ABNORMAL HIGH (ref 0.57–1.00)
Glucose: 100 mg/dL — ABNORMAL HIGH (ref 70–99)
Potassium: 5 mmol/L (ref 3.5–5.2)
Sodium: 140 mmol/L (ref 134–144)
eGFR: 56 mL/min/1.73 — ABNORMAL LOW (ref >59)

## 2024-04-08 ENCOUNTER — Other Ambulatory Visit

## 2024-04-08 ENCOUNTER — Ambulatory Visit: Payer: Self-pay | Admitting: Cardiovascular Disease

## 2024-04-08 NOTE — Telephone Encounter (Signed)
 Requested Prescriptions  Refused Prescriptions Disp Refills   carvedilol  (COREG ) 25 MG tablet 180 tablet 2    Sig: Take 1 tablet by mouth twice daily.     Cardiovascular: Beta Blockers 3 Failed - 04/08/2024  2:24 PM      Failed - Cr in normal range and within 360 days    Creat  Date Value Ref Range Status  12/09/2023 0.94 0.50 - 1.03 mg/dL Final   Creatinine, Ser  Date Value Ref Range Status  04/06/2024 1.14 (H) 0.57 - 1.00 mg/dL Final   Creatinine, Urine  Date Value Ref Range Status  03/26/2022 78 20 - 275 mg/dL Final         Failed - Last BP in normal range    BP Readings from Last 1 Encounters:  04/01/24 (!) 152/68         Passed - AST in normal range and within 360 days    AST  Date Value Ref Range Status  12/09/2023 13 10 - 35 U/L Final   SGOT(AST)  Date Value Ref Range Status  06/15/2012 14 (L) 15 - 37 Unit/L Final         Passed - ALT in normal range and within 360 days    ALT  Date Value Ref Range Status  12/09/2023 19 6 - 29 U/L Final   SGPT (ALT)  Date Value Ref Range Status  06/15/2012 25 12 - 78 U/L Final         Passed - Last Heart Rate in normal range    Pulse Readings from Last 1 Encounters:  04/01/24 (!) 58         Passed - Valid encounter within last 6 months    Recent Outpatient Visits           4 months ago Type 2 diabetes mellitus with other specified complication, without long-term current use of insulin [facility])   Beatty [facility], Marsa PARAS, DO

## 2024-04-11 ENCOUNTER — Ambulatory Visit
Admission: RE | Admit: 2024-04-11 | Discharge: 2024-04-11 | Disposition: A | Discharge status: Home / Self Care | Source: Ambulatory Visit | Attending: Cardiovascular Disease | Admitting: Cardiovascular Disease

## 2024-04-11 ENCOUNTER — Other Ambulatory Visit (INDEPENDENT_AMBULATORY_CARE_PROVIDER_SITE_OTHER): Admitting: Pharmacist

## 2024-04-11 DIAGNOSIS — I6529 Occlusion and stenosis of unspecified carotid artery: Secondary | ICD-10-CM | POA: Insufficient documentation

## 2024-04-11 DIAGNOSIS — I6521 Occlusion and stenosis of right carotid artery: Secondary | ICD-10-CM | POA: Diagnosis not present

## 2024-04-11 DIAGNOSIS — I10 Essential (primary) hypertension: Secondary | ICD-10-CM

## 2024-04-11 DIAGNOSIS — I708 Atherosclerosis of other arteries: Secondary | ICD-10-CM | POA: Diagnosis not present

## 2024-04-11 DIAGNOSIS — F17210 Nicotine dependence, cigarettes, uncomplicated: Secondary | ICD-10-CM

## 2024-04-11 DIAGNOSIS — I700 Atherosclerosis of aorta: Secondary | ICD-10-CM | POA: Diagnosis not present

## 2024-04-11 DIAGNOSIS — I2510 Atherosclerotic heart disease of native coronary artery without angina pectoris: Secondary | ICD-10-CM

## 2024-04-11 MED ORDER — IOHEXOL 350 MG/ML SOLN
75.0000 mL | Freq: Once | INTRAVENOUS | Status: AC | PRN
  Administered 2024-04-11: 75 mL via INTRAVENOUS

## 2024-04-11 NOTE — Progress Notes (Signed)
 04/11/2024 Name: Morgan Wright MRN: [id number] DOB: Mar 28, 1966  Chief Complaint  Patient presents with   Medication Management    Morgan Wright is a 58 y.o. year old female who presented for a telephone visit.   They were referred to the pharmacist by their PCP for assistance in managing medication access.   Conversation limited today as patient is currently not home  Subjective:  Care Team: Primary Care Provider: Edman Marsa PARAS, DO ; Next Scheduled Visit: 04/20/2024 Cardiologist: Gollan, Timothy J, MD  Vascular Specialist: [name], MD; Next Scheduled Visit: 04/12/2024 (CT scan today)  Medication Access/Adherence  Current Pharmacy:  [email or web address] - [place], [place] - [address] [place] [address] Phone: [phone] Fax: [phone]  [facility] [address] [place] - [address] [place] [address] Phone: [phone] Fax: [phone]  [facility] [address] [place] [address] Phone: [phone] Fax: [phone]   Patient reports affordability concerns with their medications: No  Patient reports access/transportation concerns to their pharmacy: No  Patient reports adherence concerns with their medications:  No    Patient plans to attend appointment for CT scan today as ordered by Vascular Specialist and then follow up with Dr. Marea for office visit tomorrow   PAD/ASCVD Risk Reduction/CAD  Current lipid lowering medications: rosuvastatin  40 mg daily  Antiplatelet regimen: ticagrelor  60 mg twice daily + aspirin  81 mg daily  ASCVD History: CAD/s/p CABG x 3 at [facility] in 2014     Tobacco Abuse:  Reports has cut back on smoking; currently smoking ~12-15 cigarettes/day  Motivation to quit: her health, wanting to be around to spend time with her grandkids  Hypertension:  Current  medications:  - olmesartan  40 mg daily - carvedilol  25 mg twice daily - amlodipine  10 mg daily - hydralazine  50 mg three times daily  Recalls last checked home blood pressure on 8/15, reading ~140/60s  Objective:  Lab Results  Component Value Date   HGBA1C 5.9 (A) 12/09/2023    Lab Results  Component Value Date   CREATININE 1.14 (H) 04/06/2024   BUN 13 04/06/2024   NA 140 04/06/2024   K 5.0 04/06/2024   CL 102 04/06/2024   CO2 20 04/06/2024    Lab Results  Component Value Date   CHOL 146 02/05/2022   HDL 34 (L) 02/05/2022   LDLCALC 84 02/05/2022   TRIG 185 (H) 02/05/2022   CHOLHDL 4.3 02/05/2022   BP Readings from Last 3 Encounters:  04/01/24 (!) 152/68  03/08/24 (!) 160/60  12/09/23 (!) 170/80   Pulse Readings from Last 3 Encounters:  04/01/24 (!) 58  03/08/24 67  12/09/23 65    Current Outpatient Medications on File Prior to Visit  Medication Sig Dispense Refill   albuterol  (VENTOLIN  HFA) 108 (90 Base) MCG/ACT inhaler Inhale 2 puffs into the lungs every 4 (four) hours as needed. (Patient not taking: Reported on 03/08/2024) 18 g 0   amLODipine  (NORVASC ) 10 MG tablet Take 1 tablet (10 mg total) by mouth daily. 90 tablet 1   aspirin  EC 81 MG tablet Take 1 tablet (81 mg total) by mouth daily. Swallow whole. 30 tablet 12   carvedilol  (COREG ) 25 MG tablet Take 1 tablet (25 mg total) by mouth 2 (two) times daily. 180 tablet 1   ELIQUIS  5 MG TABS tablet Take 1  tablet (5 mg total) by mouth 2 (two) times daily. (Patient not taking: Reported on 03/08/2024) 180 tablet 1   hydrALAZINE  (APRESOLINE ) 50 MG tablet Take 1 tablet (50 mg total) by mouth 3 (three) times daily. 270 tablet 3   levothyroxine  (SYNTHROID ) 50 MCG tablet TAKE 1 TABLET BY MOUTH EVERY OTHER DAY, ALTERNATING WITH OTHER DOSE OF 75 MCG EVERY OTHER DAY. 45 tablet 1   levothyroxine  (SYNTHROID ) 75 MCG tablet TAKE 1 TABLET BY MOUTH EVERY OTHER DAY AND ALTERNATE WITH 50 MCG TABLET. 45 tablet 1   metFORMIN   (GLUCOPHAGE -XR) 750 MG 24 hr tablet Take 2 tablets by mouth daily with breakfast. 180 tablet 3   NICOTINE  STEP 1 21 MG/24HR patch Place 1 patch (21 mg total) onto the skin daily. 28 patch 3   olmesartan  (BENICAR ) 40 MG tablet Take 1 tablet (40 mg total) by mouth daily. 90 tablet 1   rosuvastatin  (CRESTOR ) 40 MG tablet Take 1 tablet (40 mg total) by mouth at bedtime. 90 tablet 1   Spacer/Aero-Holding Chambers (AEROCHAMBER MV) inhaler Use as instructed (Patient not taking: Reported on 03/08/2024) 1 each 2   ticagrelor  (BRILINTA ) 60 MG TABS tablet Take 1 tablet by mouth twice daily. 180 tablet 3   No current facility-administered medications on file prior to visit.     Assessment/Plan:    PAD/ASCVD Risk Reduction/CAD - Patient plans to attend appointment for CT scan today as ordered by Vascular Specialist and then follow up with Dr. Marea tomorrow - Patient plans to discuss current medication management with Dr. Marea tomorrow, including to confirm with provider if plan for her to remain off of Eliquis  (taking ticagrelor  60 mg twice daily + aspirin  81 mg daily per Cardiologist)   Tobacco Abuse - Currently uncontrolled - Provided motivational interviewing to assess tobacco use and strategies for reduction - Patient plans to start nicotine  patch 21 mg daily Patch Schedule for >10 cigarettes daily: Apply one 21 mg patch daily for 6 weeks. Then, reduce to one 14 mg patch daily for another 2 weeks, if able. Then, reduce to one 7 mg patch for another 2 weeks, if able.  - Quit date set for: 04/24/2024  Hypertension: - Reviewed long term cardiovascular and renal outcomes of uncontrolled blood pressure - Reviewed appropriate blood pressure monitoring technique and reviewed goal blood pressure. - Recommended to check home blood pressure and heart rate, keep log of results and have this record to review at upcoming medical appointments. Patient to contact provider office sooner if needed for readings  outside of established parameters or symptoms  Follow Up Plan: Clinical Pharmacist will follow up with patient by telephone on 05/09/2024 at 10:00 AM   Sharyle Sia, PharmD, JAQUELINE, CPP Clinical Pharmacist [facility] [phone]

## 2024-04-11 NOTE — Patient Instructions (Signed)
 Goals Addressed             This Visit's Progress    Pharmacy Goals       It was great talking with you today!  I look forward to speaking with you again by telephone on 05/09/2024 at 10:00 AM.   Please feel free to give me a call sooner if needed for any medication related questions or concerns   Thank you!  Sharyle Sia, PharmD, JAQUELINE, CPP Clinical Pharmacist [facility] [phone]

## 2024-04-12 ENCOUNTER — Ambulatory Visit (INDEPENDENT_AMBULATORY_CARE_PROVIDER_SITE_OTHER): Admitting: Vascular Surgery

## 2024-04-13 ENCOUNTER — Other Ambulatory Visit

## 2024-04-13 DIAGNOSIS — Z0000 Encounter for general adult medical examination without abnormal findings: Secondary | ICD-10-CM

## 2024-04-13 DIAGNOSIS — E039 Hypothyroidism, unspecified: Secondary | ICD-10-CM

## 2024-04-13 DIAGNOSIS — E782 Mixed hyperlipidemia: Secondary | ICD-10-CM

## 2024-04-13 DIAGNOSIS — I10 Essential (primary) hypertension: Secondary | ICD-10-CM

## 2024-04-13 DIAGNOSIS — I2510 Atherosclerotic heart disease of native coronary artery without angina pectoris: Secondary | ICD-10-CM

## 2024-04-13 DIAGNOSIS — E1169 Type 2 diabetes mellitus with other specified complication: Secondary | ICD-10-CM

## 2024-04-19 ENCOUNTER — Encounter (INDEPENDENT_AMBULATORY_CARE_PROVIDER_SITE_OTHER): Payer: Self-pay | Admitting: Vascular Surgery

## 2024-04-19 ENCOUNTER — Ambulatory Visit (INDEPENDENT_AMBULATORY_CARE_PROVIDER_SITE_OTHER): Admitting: Vascular Surgery

## 2024-04-19 VITALS — BP 183/74 | HR 59 | Ht 61.0 in | Wt 175.0 lb

## 2024-04-19 DIAGNOSIS — E1169 Type 2 diabetes mellitus with other specified complication: Secondary | ICD-10-CM

## 2024-04-19 DIAGNOSIS — I6523 Occlusion and stenosis of bilateral carotid arteries: Secondary | ICD-10-CM | POA: Diagnosis not present

## 2024-04-19 DIAGNOSIS — I6529 Occlusion and stenosis of unspecified carotid artery: Secondary | ICD-10-CM | POA: Insufficient documentation

## 2024-04-19 DIAGNOSIS — I10 Essential (primary) hypertension: Secondary | ICD-10-CM

## 2024-04-19 DIAGNOSIS — E782 Mixed hyperlipidemia: Secondary | ICD-10-CM

## 2024-04-19 DIAGNOSIS — I70213 Atherosclerosis of native arteries of extremities with intermittent claudication, bilateral legs: Secondary | ICD-10-CM | POA: Diagnosis not present

## 2024-04-19 DIAGNOSIS — I70219 Atherosclerosis of native arteries of extremities with intermittent claudication, unspecified extremity: Secondary | ICD-10-CM | POA: Insufficient documentation

## 2024-04-19 DIAGNOSIS — I701 Atherosclerosis of renal artery: Secondary | ICD-10-CM

## 2024-04-19 NOTE — Assessment & Plan Note (Signed)
 lipid control important in reducing the progression of atherosclerotic disease. Continue statin therapy

## 2024-04-19 NOTE — Patient Instructions (Signed)
Carotid Angioplasty With Stent Carotid angioplasty with stent is a procedure to open or widen an artery in the neck (carotid artery) that has become narrowed. This is done by inflating a small balloon inside the artery and then placing a small piece of metal that looks like a coil or spring (stent) inside the artery. The stent helps keep the artery open by supporting the artery walls. The carotid arteries supply blood to the brain. When fats, cholesterol, and other materials (plaque) build up in an artery, the artery becomes narrow and can become blocked. This can reduce or block blood flow to certain areas of the brain, which can cause serious health problems, including stroke. The stent helps to keep the artery open so that blood can flow to the brain. Tell a health care provider about: Any allergies you have. All medicines you are taking, including vitamins, herbs, eye drops, creams, and over-the-counter medicines. Any problems you or family members have had with anesthesia. Any bleeding problems you have. Any surgeries you have had. Any medical conditions you have. Whether you are pregnant or may be pregnant. What are the risks? Your health care provider will talk with you about risks. These may include: Stroke. The stent becoming blocked. Problems at the access site, such as a large amount of blood collecting under your skin (hematoma). Allergic reactions to medicines or dyes. Damage to other structures or organs, or to the carotid artery. Infection. Heart attack. Death. This is rare. What happens before the procedure? Follow instructions from your health care provider about what you may eat and drink. Ask your health care provider about: Changing or stopping your regular medicines. These include any diabetes medicines or blood thinners (anticoagulants) you take. Whether aspirin or other blood thinners are recommended before this procedure. Taking over-the-counter medicines, vitamins,  herbs, and supplements. Do not use any products that contain nicotine or tobacco for at least 4 weeks before the procedure. These products include cigarettes, chewing tobacco, and vaping devices, such as e-cigarettes. If you need help quitting, ask your health care provider. Ask your health care provider: How your surgery site will be marked. What steps will be taken to help prevent infection. These steps may include washing skin with a soap that kills germs. You may have blood tests and imaging tests. What happens during the procedure?  An IV will be inserted into one of your veins. You may be given: A sedative. This helps you relax. Anesthesia. This will: Numb certain areas of your body. Make you fall asleep for surgery. Most commonly, an incision will be made in your groin. In some cases, an incision may be made in your wrist or forearm instead of your groin. A small, thin tube (catheter) will be inserted through an incision and into an artery. The catheter will be threaded upward into your carotid artery. An X-ray machine will help your health care provider guide the catheter to the correct place in your artery. Dye will be injected into the catheter and will travel to the narrow or blocked part of your carotid artery. X-ray images will be taken of how the dye flows through your artery. While the images are being taken, you may be given instructions about breathing, swallowing, moving, or talking. A filter will be inserted into your artery. This will be used to catch plaque that comes loose in your artery during the procedure. This reduces the risk of plaque moving into your brain. A small balloon will be inserted into your artery.  The balloon will be inflated for a few seconds to widen your artery and will then be removed. The stent will be placed in your artery. A second small balloon will be inserted into your artery and inflated. This expands the stent inside of your artery so that the  stent holds the artery walls open. The second balloon will then be removed. The catheter, filter, and first balloon will be removed from your artery and pressure will be held on your carotid artery to stop bleeding. Your incision may be closed with stitches (sutures), skin glue, or adhesive strips. A bandage (dressing) will be placed over your incision. The procedure may vary among health care providers and hospitals. What happens after the procedure? Your blood pressure, heart rate, breathing rate, and blood oxygen level will be monitored until you leave the hospital or clinic. Your mental status and movements (neurological status) will be monitored. You may need to have pressure placed on the incision site to prevent bleeding. You will need to keep the area still for a few hours, or as long as told by your health care provider. If the procedure was done in your groin, you will be told not to bend or cross your legs. Most people stay in the hospital overnight. This information is not intended to replace advice given to you by your health care provider. Make sure you discuss any questions you have with your health care provider. Document Revised: 01/07/2022 Document Reviewed: 01/07/2022 Elsevier Patient Education  2024 [employer].

## 2024-04-19 NOTE — Progress Notes (Signed)
 MRN : [id number]  Morgan Wright is a 58 y.o. ([birth year]) female who presents with chief complaint of  Chief Complaint  Patient presents with   Follow-up    - fu CT results see JD (pt has to keep Teusday appt she has to stay at work)   .  History of Present Illness: Patient returns today in follow up of multiple vascular issues, most pressing of which is her carotid stenosis.  She was seen in our office earlier this month and had multiple noninvasive studies.  Her aortoiliac duplex showed significant stenosis in both iliac arteries.  She does have significant claudication symptoms but no open wounds or rest pain currently. She also had a renal artery duplex earlier this month.  She has an occluded right renal artery.  Her duplex showed her left renal artery stent to be patent with good flow in the left renal artery.  Her blood pressure control is fair. Her most pressing issue is her carotid artery stenosis.  Although she does not have focal symptoms of cerebrovascular ischemia, her duplex earlier this month showed critically high velocities in the right carotid artery.  Her left carotid artery demonstrated only mild disease by duplex.  This prompted a CT angiogram which was done roughly 1 week ago.  I have independently reviewed this scan.  Radiology has yet to interpret the scan.  This clearly demonstrates a very high-grade right internal carotid artery stenosis just beyond its origin.  This would have the appearance of a radiographic string sign and nearly occlusive.  The distal right internal carotid artery appears to be patent but small due to malperfusion.  The left carotid artery has very mild stenosis.  There was also question of a possible stenosis at the origin of the right common carotid artery just past the innominate artery but this may very well be due to artifact coming through the right subclavian and innominate veins.  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  Current Outpatient  Medications  Medication Sig Dispense Refill   amLODipine  (NORVASC ) 10 MG tablet Take 1 tablet (10 mg total) by mouth daily. 90 tablet 1   aspirin  EC 81 MG tablet Take 1 tablet (81 mg total) by mouth daily. Swallow whole. 30 tablet 12   carvedilol  (COREG ) 25 MG tablet Take 1 tablet (25 mg total) by mouth 2 (two) times daily. 180 tablet 1   hydrALAZINE  (APRESOLINE ) 50 MG tablet Take 1 tablet (50 mg total) by mouth 3 (three) times daily. 270 tablet 3   levothyroxine  (SYNTHROID ) 50 MCG tablet TAKE 1 TABLET BY MOUTH EVERY OTHER DAY, ALTERNATING WITH OTHER DOSE OF 75 MCG EVERY OTHER DAY. 45 tablet 1   levothyroxine  (SYNTHROID ) 75 MCG tablet TAKE 1 TABLET BY MOUTH EVERY OTHER DAY AND ALTERNATE WITH 50 MCG TABLET. 45 tablet 1   metFORMIN  (GLUCOPHAGE -XR) 750 MG 24 hr tablet Take 2 tablets by mouth daily with breakfast. 180 tablet 3   NICOTINE  STEP 1 21 MG/24HR patch Place 1 patch (21 mg total) onto the skin daily. 28 patch 3   olmesartan  (BENICAR ) 40 MG tablet Take 1 tablet (40 mg total) by mouth daily. 90 tablet 1   rosuvastatin  (CRESTOR ) 40 MG tablet Take 1 tablet (40 mg total) by mouth at bedtime. 90 tablet 1   ticagrelor  (BRILINTA ) 60 MG TABS tablet Take 1 tablet by mouth twice daily. 180 tablet 3   albuterol  (VENTOLIN  HFA) 108 (90 Base) MCG/ACT inhaler Inhale 2 puffs into the  lungs every 4 (four) hours as needed. (Patient not taking: Reported on 04/19/2024) 18 g 0   ELIQUIS  5 MG TABS tablet Take 1 tablet (5 mg total) by mouth 2 (two) times daily. (Patient not taking: Reported on 04/19/2024) 180 tablet 1   Spacer/Aero-Holding Chambers (AEROCHAMBER MV) inhaler Use as instructed (Patient not taking: Reported on 04/19/2024) 1 each 2   No current facility-administered medications for this visit.    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    Past Surgical History:  Procedure Laterality Date   CARDIAC SURGERY     CESAREAN SECTION     x 3 - 1984-1994   MICROLARYNGOSCOPY W/VOCAL CORD INJECTION Bilateral 01/28/2019   Procedure: MICROLARYNGOSCOPY WITH VOCAL CORD EXCISION;  Surgeon: Herminio Miu, MD;  [facility];  Service: ENT;  Laterality: Bilateral;   TONSILLECTOMY       Social History   Tobacco Use   Smoking status: Every Day    Current packs/day: 0.75    Average packs/day: 0.8 packs/day for 40.0 years (30.0 ttl pk-yrs)    Types: Cigarettes   Smokeless tobacco: Never  Vaping Use   Vaping status: Never Used  Substance Use Topics   Alcohol use: No   Drug use: Never      Family History  Problem Relation Age of Onset   Heart disease Mother    Anuerysm Father 73   Lymphoma Sister        bur[place]     No Known Allergies   REVIEW OF SYSTEMS (Negative unless checked)  Constitutional: [] Weight loss  [] Fever  [] Chills Cardiac: [] Chest pain   [] Chest pressure   [] Palpitations   [] Shortness of breath when laying flat   [] Shortness of breath at rest   [] Shortness of breath with exertion. Vascular:  [x] Pain in legs with walking   [] Pain in legs at rest   [] Pain in legs when laying flat   [x] Claudication   [] Pain in feet when walking  [] Pain in feet at rest  [] Pain in feet when laying flat   [] History of DVT   [] Phlebitis   [] Swelling in legs   [] Varicose veins   [] Non-healing ulcers Pulmonary:   [] Uses home oxygen   [] Productive cough   [] Hemoptysis   [] Wheeze  [] COPD   [] Asthma Neurologic:  [] Dizziness  [] Blackouts   [] Seizures   [] History of stroke   [] History of TIA  [] Aphasia   [] Temporary blindness   [] Dysphagia   [] Weakness or numbness in arms   [] Weakness or numbness in legs Musculoskeletal:  [] Arthritis   [] Joint swelling   [] Joint pain    [] Low back pain Hematologic:  [] Easy bruising  [] Easy bleeding   [] Hypercoagulable state   [] Anemic   Gastrointestinal:  [] Blood in stool   [] Vomiting blood  [] Gastroesophageal reflux/heartburn   [] Abdominal pain Genitourinary:  [] Chronic kidney disease   [] Difficult urination  [] Frequent urination  [] Burning with urination   [] Hematuria Skin:  [] Rashes   [] Ulcers   [] Wounds Psychological:  [] History of anxiety   []  History of major depression.  Physical Examination  BP (!) 183/74   Pulse (!) 59   Ht 5' 1 (  1.549 m)   Wt 175 lb (79.4 kg)   BMI 33.07 kg/m  Gen:  WD/WN, NAD. Appears older than stated age. Head: [place]/AT, No temporalis wasting. Ear/Nose/Throat: Hearing grossly intact, nares w/o erythema or drainage Eyes: Conjunctiva clear. Sclera non-icteric Neck: Supple.  Trachea midline Pulmonary:  Good air movement, no use of accessory muscles.  Cardiac: bradycardic Vascular:  Vessel Right Left  Radial Palpable Palpable                          PT 1+ palpable 1+ palpable  DP 1+ palpable 1+ palpable   Gastrointestinal: soft, non-tender/non-distended. No guarding/reflex.  Musculoskeletal: M/S 5/5 throughout.  No deformity or atrophy.  No significant lower extremity edema. Neurologic: Sensation grossly intact in extremities.  Symmetrical.  Speech is fluent.  Psychiatric: Judgment intact, Mood & affect appropriate for pt's clinical situation. Dermatologic: No rashes or ulcers noted.  No cellulitis or open wounds.      Labs Recent Results (from the past 2160 hours)  Basic metabolic panel with GFR     Status: Abnormal   Collection Time: 04/06/24 11:26 AM  Result Value Ref Range   Glucose 100 (H) 70 - 99 mg/dL   BUN 13 6 - 24 mg/dL   Creatinine, Ser 8.85 (H) 0.57 - 1.00 mg/dL   eGFR 56 (L) >40 fO/fpw/8.26   BUN/Creatinine Ratio 11 9 - 23   Sodium 140 134 - 144 mmol/L   Potassium 5.0 3.5 - 5.2 mmol/L   Chloride 102 96 - 106 mmol/L   CO2 20 20 - 29 mmol/L   Calcium   10.1 8.7 - 10.2 mg/dL    Radiology VAS US  RENAL ARTERY DUPLEX Result Date: 04/04/2024 ABDOMINAL VISCERAL Patient Name:  TIA DELENA PIETY  Date of Exam:   04/01/2024 Medical Rec #: [id number]        Accession #:    [id number] Date of Birth: 19-Oct-1965        Patient Gender: F Patient Age:   39 years Exam Location:  [facility] Procedure:      VAS US  RENAL ARTERY DUPLEX Referring Phys: [id number] ORVIN BRAVO BROWN -------------------------------------------------------------------------------- Indications: Rt Kidney atrophy and occluded RA. Left kidney RAS with RA stent              2023 [facility] Limitations: Air/bowel gas. Comparison Study: none available Performing Technologist: Jerel Croak RVT  Examination Guidelines: A complete evaluation includes B-mode imaging, spectral Doppler, color Doppler, and power Doppler as needed of all accessible portions of each vessel. Bilateral testing is considered an integral part of a complete examination. Limited examinations for reoccurring indications may be performed as noted.  Duplex Findings: +----------+--------+--------+------+--------+ MesentericPSV cm/sEDV cm/sPlaqueComments +----------+--------+--------+------+--------+ Aorta Mid    95                          +----------+--------+--------+------+--------+    +-----------------+--------+--------+-------+ Left Renal ArteryPSV cm/sEDV cm/sComment +-----------------+--------+--------+-------+ Proximal            85                   +-----------------+--------+--------+-------+ Mid                 71                   +-----------------+--------+--------+-------+ Distal              54                   +-----------------+--------+--------+-------+ +------------+--------+--------+--+-----------+--------+--------+----+  Right KidneyPSV cm/sEDV cm/sRILeft KidneyPSV cm/sEDV cm/sRI   +------------+--------+--------+--+-----------+--------+--------+----+ Upper Pole                     Upper Pole                      +------------+--------+--------+--+-----------+--------+--------+----+ Mid                           Mid        20      6       0.71 +------------+--------+--------+--+-----------+--------+--------+----+ Lower Pole                    Lower Pole                      +------------+--------+--------+--+-----------+--------+--------+----+ Hilar                         Hilar                           +------------+--------+--------+--+-----------+--------+--------+----+ +------------------+----+------------------+-----+ Right Kidney          Left Kidney             +------------------+----+------------------+-----+ RAR                   RAR                     +------------------+----+------------------+-----+ RAR (manual)          RAR (manual)      .89   +------------------+----+------------------+-----+ Cortex                Cortex                  +------------------+----+------------------+-----+ Cortex thickness      Corex thickness         +------------------+----+------------------+-----+ Kidney length (cm)6.02Kidney length (cm)11.48 +------------------+----+------------------+-----+  Summary: Renal:  Right: Abnormal size for the right kidney. No RA/kidney blood flow        identified. Hx occluded RA. Left:  Normal size of left kidney. Normal left Resistive Index.        Normal cortical thickness of the left kidney. 1-59% stenosis        of the left renal artery. LRV flow present. Evidence of        patent RA stent.  *See table(s) above for measurements and observations.  Diagnosing physician: Selinda Gu MD  Electronically signed by Selinda Gu MD on 04/04/2024 at 7:53:30 AM.    Final    VAS US  AORTA/IVC/ILIACS Result Date: 03/31/2024 ABDOMINAL AORTA STUDY Patient Name:  TIA DELENA PIETY  Date of Exam:   03/30/2024 Medical Rec #: [id number]        Accession #:    [id number] Date of Birth: July 24, 1966        Patient Gender: F  Patient Age:   48 years Exam Location:  [place] Procedure:      VAS US  AORTA/IVC/ILIACS Referring Phys: EVALENE LUNGER --------------------------------------------------------------------------------  Risk Factors: Hypertension, hyperlipidemia, Diabetes, current smoker, coronary               artery disease. Other Factors: Patient complains of pain in her hips and thighs with walking. Limitations: Obesity and air/bowel gas.  Performing Technologist: Alan Greenhouse RDMS, RVT, RDCS  Examination Guidelines: A complete  evaluation includes B-mode imaging, spectral Doppler, color Doppler, and power Doppler as needed of all accessible portions of each vessel. Bilateral testing is considered an integral part of a complete examination. Limited examinations for reoccurring indications may be performed as noted.  Abdominal Aorta Findings: +-------------+-------+----------+----------+--------+--------+--------+ Location     AP (cm)Trans (cm)PSV (cm/s)WaveformThrombusComments +-------------+-------+----------+----------+--------+--------+--------+ Proximal     1.60   1.70      92                                 +-------------+-------+----------+----------+--------+--------+--------+ Mid          1.30   1.50      131                                +-------------+-------+----------+----------+--------+--------+--------+ Distal       1.70   1.90      174                                +-------------+-------+----------+----------+--------+--------+--------+ RT CIA Prox  0.5    0.7       388                                +-------------+-------+----------+----------+--------+--------+--------+ RT CIA Mid                    390                                +-------------+-------+----------+----------+--------+--------+--------+ RT CIA Distal                 407                                +-------------+-------+----------+----------+--------+--------+--------+ RT EIA  Prox                   308                                +-------------+-------+----------+----------+--------+--------+--------+ RT EIA Mid                    265                                +-------------+-------+----------+----------+--------+--------+--------+ RT EIA Distal0.5    0.5       181                                +-------------+-------+----------+----------+--------+--------+--------+ LT CIA Prox  0.4    0.6       347                                +-------------+-------+----------+----------+--------+--------+--------+ LT CIA Mid                    345                                +-------------+-------+----------+----------+--------+--------+--------+  LT CIA Distal                 335                                +-------------+-------+----------+----------+--------+--------+--------+ LT EIA Prox                   329                                +-------------+-------+----------+----------+--------+--------+--------+ LT EIA Mid                    205                                +-------------+-------+----------+----------+--------+--------+--------+ LT EIA Distal0.6    0.7       175                                +-------------+-------+----------+----------+--------+--------+--------+ Small caliber abdominal aorta with dilatation in the distal aorta. Small caliber iliac arteries, bilaterally. Aorto-iliac atherosclerosis with focal stenosis seen throughout the iliac arteries, bilaterally. IVC/Iliac Findings: +--------+------+--------+--------+   IVC   PatentThrombusComments +--------+------+--------+--------+ IVC Proxpatent                 +--------+------+--------+--------+    Summary: Abdominal Aorta: No evidence of an abdominal aortic aneurysm was visualized. The largest aortic measurement is 1.9 cm. Small caliber abdominal aorta with mild dilatation in the distal aorta. Stenosis:  +--------------------+-------------+ Location            Stenosis      +--------------------+-------------+ Right Common Iliac  >50% stenosis +--------------------+-------------+ Left Common Iliac   >50% stenosis +--------------------+-------------+ Right External Iliac>50% stenosis +--------------------+-------------+ Left External Iliac >50% stenosis +--------------------+-------------+  IVC/Iliac: There is no evidence of thrombus involving the IVC.  *See table(s) above for measurements and observations. Suggest Peripheral Vascular Consult.  Electronically signed by Dorn Ross MD on 03/31/2024 at 10:14:09 AM.    Final    VAS US  CAROTID Result Date: 03/30/2024 Carotid Arterial Duplex Study Patient Name:  TIA DELENA PIETY  Date of Exam:   03/30/2024 Medical Rec #: [id number]        Accession #:    [id number] Date of Birth: [birth year]        Patient Gender: F Patient Age:   35 years Exam Location:  [place] Procedure:      VAS US  CAROTID Referring Phys: EVALENE LUNGER --------------------------------------------------------------------------------  Indications:                            Left bruit. Risk Factors:                           Hypertension, hyperlipidemia, Diabetes,                                         current smoker, coronary artery  disease. Pre-Surgical Evaluation & Surgical      Stenosis at [facility] only. ICA is normal Correlation                             past the stenosis. Anatomy on the right                                         is within normal limits.Right                                         bifurcation is located near the Hyoid                                         Notch. Performing Technologist: Alan Greenhouse RDMS, RVT, RDCS  Examination Guidelines: A complete evaluation includes B-mode imaging, spectral Doppler, color Doppler, and power Doppler as needed of all accessible portions of each vessel. Bilateral testing is  considered an integral part of a complete examination. Limited examinations for reoccurring indications may be performed as noted.  Right Carotid Findings: +----------+--------+--------+--------+------------------+--------+           PSV cm/sEDV cm/sStenosisPlaque DescriptionComments +----------+--------+--------+--------+------------------+--------+ CCA Prox  128     19                                         +----------+--------+--------+--------+------------------+--------+ CCA Distal102     28                                         +----------+--------+--------+--------+------------------+--------+ ICA Prox  593     165     80-99%  homogeneous                +----------+--------+--------+--------+------------------+--------+ ICA Mid   163     58                                         +----------+--------+--------+--------+------------------+--------+ ICA Distal116     23                                         +----------+--------+--------+--------+------------------+--------+ ECA       289     35      >50%    homogeneous                +----------+--------+--------+--------+------------------+--------+ +----------+--------+-------+----------------+-------------------+           PSV cm/sEDV cmsDescribe        Arm Pressure (mmHG) +----------+--------+-------+----------------+-------------------+ Subclavian161     0      Multiphasic, [id number]                 +----------+--------+-------+----------------+-------------------+ +---------+--------+---+--------+--+---------+ VertebralPSV cm/s122EDV cm/s30Antegrade +---------+--------+---+--------+--+---------+  Left Carotid Findings: +----------+--------+--------+--------+------------------------+--------+  PSV cm/sEDV cm/sStenosisPlaque Description      Comments +----------+--------+--------+--------+------------------------+--------+ CCA Prox  150     38                                                +----------+--------+--------+--------+------------------------+--------+ CCA Distal139     40      <50%    calcific and homogeneous         +----------+--------+--------+--------+------------------------+--------+ ICA Prox  133     37      1-39%   calcific                         +----------+--------+--------+--------+------------------------+--------+ ICA Mid   146     42                                               +----------+--------+--------+--------+------------------------+--------+ ICA Distal99      25                                               +----------+--------+--------+--------+------------------------+--------+ ECA       334     35      >50%    calcific and homogeneous         +----------+--------+--------+--------+------------------------+--------+ +----------+--------+--------+----------------+-------------------+           PSV cm/sEDV cm/sDescribe        Arm Pressure (mmHG) +----------+--------+--------+----------------+-------------------+ [id number]     0       Multiphasic, [id number]                 +----------+--------+--------+----------------+-------------------+ +---------+--------+--+--------+--+---------+ VertebralPSV cm/s85EDV cm/s24Antegrade +---------+--------+--+--------+--+---------+   Summary: Right Carotid: Velocities in the right ICA are consistent with a 80-99%                stenosis. The ECA appears >50% stenosed. Left Carotid: Velocities in the left ICA are consistent with a 1-39% stenosis.               Non-hemodynamically significant plaque <50% noted in the CCA. The               ECA appears >50% stenosed. Vertebrals:  Bilateral vertebral arteries demonstrate antegrade flow. Subclavians: Normal flow hemodynamics were seen in bilateral subclavian              arteries. *See table(s) above for measurements and observations.  Suggest Peripheral Vascular Consult. Electronically signed by Dorn Ross MD on 03/30/2024 at 1:47:15 PM.    Final     Assessment/Plan  Essential hypertension blood pressure control important in reducing the progression of atherosclerotic disease. On appropriate oral medications.   Type 2 diabetes mellitus with other specified complication (HCC) blood glucose control important in reducing the progression of atherosclerotic disease. Also, involved in wound healing. On appropriate medications.   Hyperlipidemia, mixed lipid control important in reducing the progression of atherosclerotic disease. Continue statin therapy   Atherosclerotic renal artery stenosis (HCC) She also had a renal artery duplex earlier this month.  She has an occluded right renal artery.  Her duplex showed her left renal artery stent to  be patent with good flow in the left renal artery.  Would plan to follow this once to twice a year with duplex.  Atherosclerosis of native arteries of extremity with intermittent claudication (HCC) Aortoiliac duplex does show what appears to be significant iliac artery disease bilaterally.  This could complicate carotid artery stenting, but I would not plan immediate intervention on this as there is not any acute limb threatening symptom and her carotid disease would take precedence.  This may be something that needs to be addressed in the future due to her claudication symptoms.  Carotid artery stenosis her duplex earlier this month showed critically high velocities in the right carotid artery.  Her left carotid artery demonstrated only mild disease by duplex.  This prompted a CT angiogram which was done roughly 1 week ago.  I have independently reviewed this scan.  Radiology has yet to interpret the scan.  This clearly demonstrates a very high-grade right internal carotid artery stenosis just beyond its origin.  This would have the appearance of a radiographic string sign and nearly occlusive.  The distal right internal carotid artery appears to be patent  but small due to malperfusion.  The left carotid artery has very mild stenosis.  There was also question of a possible stenosis at the origin of the right common carotid artery just past the innominate artery but this may very well be due to artifact coming through the right subclavian and innominate veins.  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 This is clearly a critical situation with a high risk of stroke that could be potentially life-threatening.  Although her iliac artery disease could complicate carotid stenting somewhat, due to the questionable lesion at the origin of the right common carotid artery angiography with possible stenting would be preferred if her iliac arteries will allow this.  This would allow interrogation of this proximal lesion and potentially treatment of this as well if it is in fact a true lesion.  I have discussed that she really has a string sign stenosis and that intervention or surgery would clearly benefit her from a stroke reduction standpoint.  I have discussed the risks and benefits of carotid artery angiography and stenting.  I discussed the differences between that and carotid endarterectomy.  Patient voices her understanding and is agreeable to proceed.  She will continue on her dual antiplatelet therapy with aspirin  and Brilinta .  She will also continue her statin agent with Crestor .    Selinda Gu, MD  04/19/2024 4:45 PM    This note was created with Dragon medical transcription system.  Any errors from dictation are purely unintentional

## 2024-04-19 NOTE — Assessment & Plan Note (Signed)
 blood pressure control important in reducing the progression of atherosclerotic disease. On appropriate oral medications.

## 2024-04-19 NOTE — Assessment & Plan Note (Signed)
 her duplex earlier this month showed critically high velocities in the right carotid artery.  Her left carotid artery demonstrated only mild disease by duplex.  This prompted a CT angiogram which was done roughly 1 week ago.  I have independently reviewed this scan.  Radiology has yet to interpret the scan.  This clearly demonstrates a very high-grade right internal carotid artery stenosis just beyond its origin.  This would have the appearance of a radiographic string sign and nearly occlusive.  The distal right internal carotid artery appears to be patent but small due to malperfusion.  The left carotid artery has very mild stenosis.  There was also question of a possible stenosis at the origin of the right common carotid artery just past the innominate artery but this may very well be due to artifact coming through the right subclavian and innominate veins.  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 This is clearly a critical situation with a high risk of stroke that could be potentially life-threatening.  Although her iliac artery disease could complicate carotid stenting somewhat, due to the questionable lesion at the origin of the right common carotid artery angiography with possible stenting would be preferred if her iliac arteries will allow this.  This would allow interrogation of this proximal lesion and potentially treatment of this as well if it is in fact a true lesion.  I have discussed that she really has a string sign stenosis and that intervention or surgery would clearly benefit her from a stroke reduction standpoint.  I have discussed the risks and benefits of carotid artery angiography and stenting.  I discussed the differences between that and carotid endarterectomy.  Patient voices her understanding and is agreeable to proceed.  She will continue on her dual antiplatelet therapy with aspirin  and Brilinta .  She will also continue her statin agent with Crestor .

## 2024-04-19 NOTE — Assessment & Plan Note (Signed)
 blood glucose control important in reducing the progression of atherosclerotic disease. Also, involved in wound healing. On appropriate medications.

## 2024-04-19 NOTE — Assessment & Plan Note (Signed)
 Aortoiliac duplex does show what appears to be significant iliac artery disease bilaterally.  This could complicate carotid artery stenting, but I would not plan immediate intervention on this as there is not any acute limb threatening symptom and her carotid disease would take precedence.  This may be something that needs to be addressed in the future due to her claudication symptoms.

## 2024-04-19 NOTE — Assessment & Plan Note (Signed)
 She also had a renal artery duplex earlier this month.  She has an occluded right renal artery.  Her duplex showed her left renal artery stent to be patent with good flow in the left renal artery.  Would plan to follow this once to twice a year with duplex.

## 2024-04-20 ENCOUNTER — Encounter: Admitting: Family Medicine

## 2024-04-21 ENCOUNTER — Telehealth (INDEPENDENT_AMBULATORY_CARE_PROVIDER_SITE_OTHER): Payer: Self-pay

## 2024-04-21 NOTE — Telephone Encounter (Signed)
 I attempted to contact the patient regarding being scheduled for a right carotid stent placement with Dr. Marea. Unable to leave a message as the voice mail box was full. I then call the home number and spoke with someone to have her call me.

## 2024-04-22 ENCOUNTER — Other Ambulatory Visit

## 2024-04-22 ENCOUNTER — Telehealth (INDEPENDENT_AMBULATORY_CARE_PROVIDER_SITE_OTHER): Payer: Self-pay

## 2024-04-22 DIAGNOSIS — E039 Hypothyroidism, unspecified: Secondary | ICD-10-CM | POA: Diagnosis not present

## 2024-04-22 DIAGNOSIS — I10 Essential (primary) hypertension: Secondary | ICD-10-CM | POA: Diagnosis not present

## 2024-04-22 DIAGNOSIS — E782 Mixed hyperlipidemia: Secondary | ICD-10-CM | POA: Diagnosis not present

## 2024-04-22 DIAGNOSIS — I2510 Atherosclerotic heart disease of native coronary artery without angina pectoris: Secondary | ICD-10-CM | POA: Diagnosis not present

## 2024-04-22 NOTE — Telephone Encounter (Signed)
 I attempted to contact the patient to schedule a right carotid stent placement with Dr. Marea. A message was left for a return call.

## 2024-04-23 LAB — COMPREHENSIVE METABOLIC PANEL WITH GFR
AG Ratio: 1.8 (calc) (ref 1.0–2.5)
ALT: 19 U/L (ref 6–29)
AST: 19 U/L (ref 10–35)
Albumin: 4.5 g/dL (ref 3.6–5.1)
Alkaline phosphatase (APISO): 54 U/L (ref 37–153)
BUN/Creatinine Ratio: 11 (calc) (ref 6–22)
BUN: 14 mg/dL (ref 7–25)
CO2: 24 mmol/L (ref 20–32)
Calcium: 9.8 mg/dL (ref 8.6–10.4)
Chloride: 104 mmol/L (ref 98–110)
Creat: 1.27 mg/dL — ABNORMAL HIGH (ref 0.50–1.03)
Globulin: 2.5 g/dL (ref 1.9–3.7)
Glucose, Bld: 94 mg/dL (ref 65–99)
Potassium: 4.5 mmol/L (ref 3.5–5.3)
Sodium: 137 mmol/L (ref 135–146)
Total Bilirubin: 0.5 mg/dL (ref 0.2–1.2)
Total Protein: 7 g/dL (ref 6.1–8.1)
eGFR: 49 mL/min/1.73m2 — ABNORMAL LOW (ref >=60)

## 2024-04-23 LAB — CBC WITH DIFFERENTIAL/PLATELET
Absolute Lymphocytes: 3607 {cells}/uL (ref 850–3900)
Absolute Monocytes: 753 {cells}/uL (ref 200–950)
Basophils Absolute: 85 {cells}/uL (ref 0–200)
Basophils Relative: 0.6 %
Eosinophils Absolute: 128 {cells}/uL (ref 15–500)
Eosinophils Relative: 0.9 %
HCT: 42.9 % (ref 35.0–45.0)
Hemoglobin: 14.2 g/dL (ref 11.7–15.5)
MCH: 29.5 pg (ref 27.0–33.0)
MCHC: 33.1 g/dL (ref 32.0–36.0)
MCV: 89.2 fL (ref 80.0–100.0)
MPV: 9.7 fL (ref 7.5–12.5)
Monocytes Relative: 5.3 %
Neutro Abs: 9628 {cells}/uL — ABNORMAL HIGH (ref 1500–7800)
Neutrophils Relative %: 67.8 %
Platelets: 271 Thousand/uL (ref 140–400)
RBC: 4.81 Million/uL (ref 3.80–5.10)
RDW: 13 % (ref 11.0–15.0)
Total Lymphocyte: 25.4 %
WBC: 14.2 Thousand/uL — ABNORMAL HIGH (ref 3.8–10.8)

## 2024-04-23 LAB — LIPID PANEL
Cholesterol: 109 mg/dL (ref <200)
HDL: 35 mg/dL — ABNORMAL LOW (ref >=50)
LDL Cholesterol (Calc): 50 mg/dL
Non-HDL Cholesterol (Calc): 74 mg/dL (ref <130)
Total CHOL/HDL Ratio: 3.1 (calc) (ref <5.0)
Triglycerides: 162 mg/dL — ABNORMAL HIGH (ref <150)

## 2024-04-23 LAB — T4, FREE: Free T4: 1.3 ng/dL (ref 0.8–1.8)

## 2024-04-23 LAB — HEMOGLOBIN A1C
Hgb A1c MFr Bld: 6.1 % — ABNORMAL HIGH (ref <5.7)
Mean Plasma Glucose: 128 mg/dL
eAG (mmol/L): 7.1 mmol/L

## 2024-04-23 LAB — MICROALBUMIN / CREATININE URINE RATIO
Creatinine, Urine: 24 mg/dL (ref 20–275)
Microalb Creat Ratio: 50 mg/g{creat} — ABNORMAL HIGH (ref <30)
Microalb, Ur: 1.2 mg/dL

## 2024-04-23 LAB — TSH: TSH: 9.5 m[IU]/L — ABNORMAL HIGH (ref 0.40–4.50)

## 2024-04-26 NOTE — Telephone Encounter (Signed)
 Patient returned my call and requested to be scheduled for her right carotid stent placement with Dr. Marea on 05/23/24. Patient to arrive to the [facility] at 6:45 am. Pre-procedure instructions were discussed and will be sent to Mychart and mailed.

## 2024-04-29 ENCOUNTER — Ambulatory Visit: Payer: Self-pay | Admitting: Family Medicine

## 2024-05-09 ENCOUNTER — Telehealth: Payer: Self-pay | Admitting: Pharmacist

## 2024-05-09 ENCOUNTER — Other Ambulatory Visit

## 2024-05-09 NOTE — Progress Notes (Signed)
   Outreach Note  05/09/2024 Name: Morgan Wright MRN: [id number] DOB: 1966-05-31  Referred by: Edman Marsa PARAS, DO  Was unable to reach patient via telephone today and unable to leave a message as voicemail box is full   Follow Up Plan: Will attempt to reach patient by telephone again within the next 30 days  Sharyle Sia, PharmD, [facility] Clinical Pharmacist [facility] [phone]

## 2024-05-23 ENCOUNTER — Inpatient Hospital Stay
Admission: RE | Admit: 2024-05-23 | Discharge: 2024-05-24 | DRG: 036 | Disposition: A | Attending: Vascular Surgery | Admitting: Vascular Surgery

## 2024-05-23 ENCOUNTER — Encounter: Payer: Self-pay | Admitting: Vascular Surgery

## 2024-05-23 ENCOUNTER — Other Ambulatory Visit: Payer: Self-pay

## 2024-05-23 ENCOUNTER — Encounter
Admission: RE | Disposition: A | Discharge status: Home / Self Care | Payer: Self-pay | Source: Home / Self Care | Attending: Vascular Surgery

## 2024-05-23 DIAGNOSIS — Z8249 Family history of ischemic heart disease and other diseases of the circulatory system: Secondary | ICD-10-CM | POA: Diagnosis not present

## 2024-05-23 DIAGNOSIS — Z8619 Personal history of other infectious and parasitic diseases: Secondary | ICD-10-CM

## 2024-05-23 DIAGNOSIS — I701 Atherosclerosis of renal artery: Secondary | ICD-10-CM | POA: Diagnosis not present

## 2024-05-23 DIAGNOSIS — I6521 Occlusion and stenosis of right carotid artery: Principal | ICD-10-CM

## 2024-05-23 DIAGNOSIS — I10 Essential (primary) hypertension: Secondary | ICD-10-CM | POA: Diagnosis not present

## 2024-05-23 DIAGNOSIS — F17210 Nicotine dependence, cigarettes, uncomplicated: Secondary | ICD-10-CM | POA: Diagnosis present

## 2024-05-23 DIAGNOSIS — I2510 Atherosclerotic heart disease of native coronary artery without angina pectoris: Secondary | ICD-10-CM | POA: Diagnosis not present

## 2024-05-23 DIAGNOSIS — Z807 Family history of other malignant neoplasms of lymphoid, hematopoietic and related tissues: Secondary | ICD-10-CM | POA: Diagnosis not present

## 2024-05-23 DIAGNOSIS — I70219 Atherosclerosis of native arteries of extremities with intermittent claudication, unspecified extremity: Secondary | ICD-10-CM | POA: Diagnosis present

## 2024-05-23 DIAGNOSIS — Q2549 Other congenital malformations of aorta: Secondary | ICD-10-CM

## 2024-05-23 DIAGNOSIS — Z951 Presence of aortocoronary bypass graft: Secondary | ICD-10-CM

## 2024-05-23 DIAGNOSIS — E782 Mixed hyperlipidemia: Secondary | ICD-10-CM | POA: Diagnosis not present

## 2024-05-23 DIAGNOSIS — E1169 Type 2 diabetes mellitus with other specified complication: Secondary | ICD-10-CM | POA: Diagnosis present

## 2024-05-23 HISTORY — PX: CAROTID PTA/STENT INTERVENTION: CATH118231

## 2024-05-23 LAB — CREATININE, SERUM
Creatinine, Ser: 1.29 mg/dL — ABNORMAL HIGH (ref 0.44–1.00)
GFR, Estimated: 48 mL/min — ABNORMAL LOW (ref >60)

## 2024-05-23 LAB — MRSA NEXT GEN BY PCR, NASAL: MRSA by PCR Next Gen: NOT DETECTED

## 2024-05-23 LAB — BUN: BUN: 19 mg/dL (ref 6–20)

## 2024-05-23 LAB — GLUCOSE, CAPILLARY
Glucose-Capillary: 116 mg/dL — ABNORMAL HIGH (ref 70–99)
Glucose-Capillary: 130 mg/dL — ABNORMAL HIGH (ref 70–99)
Glucose-Capillary: 110 mg/dL — ABNORMAL HIGH (ref 70–99)

## 2024-05-23 LAB — POCT ACTIVATED CLOTTING TIME: Activated Clotting Time: 302 s

## 2024-05-23 SURGERY — CAROTID PTA/STENT INTERVENTION
Anesthesia: Moderate Sedation | Laterality: Right

## 2024-05-23 MED ORDER — ASPIRIN 81 MG PO TBEC
81.0000 mg | DELAYED_RELEASE_TABLET | Freq: Every day | ORAL | Status: DC
  Administered 2024-05-24: 81 mg via ORAL
  Filled 2024-05-23: qty 1

## 2024-05-23 MED ORDER — MIDAZOLAM HCL 2 MG/2ML IJ SOLN
INTRAMUSCULAR | Status: AC
  Filled 2024-05-23: qty 2

## 2024-05-23 MED ORDER — NICOTINE 21 MG/24HR TD PT24
21.0000 mg | MEDICATED_PATCH | Freq: Every day | TRANSDERMAL | Status: DC
  Administered 2024-05-23: 21 mg via TRANSDERMAL
  Filled 2024-05-23 (×2): qty 1

## 2024-05-23 MED ORDER — CEFAZOLIN SODIUM-DEXTROSE 2-4 GM/100ML-% IV SOLN
INTRAVENOUS | Status: AC
  Filled 2024-05-23: qty 100

## 2024-05-23 MED ORDER — CARVEDILOL 25 MG PO TABS: 25.0000 mg | ORAL_TABLET | Freq: Two times a day (BID) | ORAL | Status: DC

## 2024-05-23 MED ORDER — HEPARIN SODIUM (PORCINE) 1000 UNIT/ML IJ SOLN
INTRAMUSCULAR | Status: AC
  Filled 2024-05-23: qty 10

## 2024-05-23 MED ORDER — ONDANSETRON HCL 4 MG/2ML IJ SOLN: 4.0000 mg | Freq: Four times a day (QID) | INTRAMUSCULAR | Status: DC | PRN

## 2024-05-23 MED ORDER — ORAL CARE MOUTH RINSE: 15.0000 mL | OROMUCOSAL | Status: DC | PRN

## 2024-05-23 MED ORDER — LABETALOL HCL 5 MG/ML IV SOLN: 10.0000 mg | INTRAVENOUS | Status: DC | PRN

## 2024-05-23 MED ORDER — PHENOL 1.4 % MT LIQD: 1.0000 | OROMUCOSAL | Status: DC | PRN

## 2024-05-23 MED ORDER — MIDAZOLAM HCL 2 MG/2ML IJ SOLN
INTRAMUSCULAR | Status: DC | PRN
  Administered 2024-05-23 (×2): 1 mg via INTRAVENOUS

## 2024-05-23 MED ORDER — HEPARIN (PORCINE) IN NACL 1000-0.9 UT/500ML-% IV SOLN
INTRAVENOUS | Status: DC | PRN
  Administered 2024-05-23: 3000 mL
  Administered 2024-05-23: 2000 mL

## 2024-05-23 MED ORDER — MORPHINE SULFATE (PF) 2 MG/ML IV SOLN: 2.0000 mg | INTRAVENOUS | Status: DC | PRN

## 2024-05-23 MED ORDER — POTASSIUM CHLORIDE CRYS ER 20 MEQ PO TBCR: 40.0000 meq | EXTENDED_RELEASE_TABLET | Freq: Every day | ORAL | Status: DC | PRN

## 2024-05-23 MED ORDER — METOPROLOL TARTRATE 5 MG/5ML IV SOLN: 2.5000 mg | INTRAVENOUS | Status: DC | PRN

## 2024-05-23 MED ORDER — ACETAMINOPHEN 325 MG PO TABS
325.0000 mg | ORAL_TABLET | ORAL | Status: DC | PRN
  Administered 2024-05-23: 650 mg via ORAL
  Filled 2024-05-23: qty 2

## 2024-05-23 MED ORDER — IRBESARTAN 150 MG PO TABS
300.0000 mg | ORAL_TABLET | Freq: Every day | ORAL | Status: DC
  Administered 2024-05-24: 300 mg via ORAL
  Filled 2024-05-23: qty 2

## 2024-05-23 MED ORDER — MIDAZOLAM HCL 2 MG/ML PO SYRP: 8.0000 mg | ORAL_SOLUTION | Freq: Once | ORAL | Status: DC | PRN

## 2024-05-23 MED ORDER — ROSUVASTATIN CALCIUM 20 MG PO TABS
40.0000 mg | ORAL_TABLET | Freq: Every day | ORAL | Status: DC
  Administered 2024-05-23: 40 mg via ORAL
  Filled 2024-05-23: qty 2
  Filled 2024-05-23: qty 4

## 2024-05-23 MED ORDER — FENTANYL CITRATE (PF) 100 MCG/2ML IJ SOLN
INTRAMUSCULAR | Status: DC | PRN
  Administered 2024-05-23: 50 ug via INTRAVENOUS

## 2024-05-23 MED ORDER — FAMOTIDINE 20 MG PO TABS: 40.0000 mg | ORAL_TABLET | Freq: Once | ORAL | Status: DC | PRN

## 2024-05-23 MED ORDER — SODIUM CHLORIDE 0.9 % IV SOLN: INTRAVENOUS | Status: DC

## 2024-05-23 MED ORDER — PHENYLEPHRINE HCL-NACL 20-0.9 MG/250ML-% IV SOLN
INTRAVENOUS | Status: AC
  Filled 2024-05-23: qty 250

## 2024-05-23 MED ORDER — IODIXANOL 320 MG/ML IV SOLN
INTRAVENOUS | Status: DC | PRN
  Administered 2024-05-23: 70 mL

## 2024-05-23 MED ORDER — ATROPINE SULFATE 1 MG/10ML IJ SOSY
PREFILLED_SYRINGE | INTRAMUSCULAR | Status: AC
  Filled 2024-05-23: qty 20

## 2024-05-23 MED ORDER — PHENYLEPHRINE 80 MCG/ML (10ML) SYRINGE FOR IV PUSH (FOR BLOOD PRESSURE SUPPORT)
PREFILLED_SYRINGE | INTRAVENOUS | Status: AC
  Filled 2024-05-23: qty 10

## 2024-05-23 MED ORDER — HYDRALAZINE HCL 50 MG PO TABS
50.0000 mg | ORAL_TABLET | Freq: Three times a day (TID) | ORAL | Status: DC
  Administered 2024-05-23 – 2024-05-24 (×3): 50 mg via ORAL
  Filled 2024-05-23 (×3): qty 1

## 2024-05-23 MED ORDER — LIDOCAINE-EPINEPHRINE (PF) 1 %-1:200000 IJ SOLN
INTRAMUSCULAR | Status: DC | PRN
  Administered 2024-05-23: 10 mL

## 2024-05-23 MED ORDER — CHLORHEXIDINE GLUCONATE CLOTH 2 % EX PADS
6.0000 | MEDICATED_PAD | Freq: Every day | CUTANEOUS | Status: DC
  Administered 2024-05-23 – 2024-05-24 (×2): 6 via TOPICAL

## 2024-05-23 MED ORDER — HYDRALAZINE HCL 20 MG/ML IJ SOLN: 5.0000 mg | INTRAMUSCULAR | Status: DC | PRN

## 2024-05-23 MED ORDER — METHYLPREDNISOLONE SODIUM SUCC 125 MG IJ SOLR: 125.0000 mg | Freq: Once | INTRAMUSCULAR | Status: DC | PRN

## 2024-05-23 MED ORDER — SODIUM CHLORIDE 0.9 % IV SOLN: 500.0000 mL | Freq: Once | INTRAVENOUS | Status: DC | PRN

## 2024-05-23 MED ORDER — HYDROMORPHONE HCL 1 MG/ML IJ SOLN: 1.0000 mg | Freq: Once | INTRAMUSCULAR | Status: DC | PRN

## 2024-05-23 MED ORDER — DIPHENHYDRAMINE HCL 50 MG/ML IJ SOLN: 50.0000 mg | Freq: Once | INTRAMUSCULAR | Status: DC | PRN

## 2024-05-23 MED ORDER — FENTANYL CITRATE PF 50 MCG/ML IJ SOSY
PREFILLED_SYRINGE | INTRAMUSCULAR | Status: AC
  Filled 2024-05-23: qty 1

## 2024-05-23 MED ORDER — LEVOTHYROXINE SODIUM 50 MCG PO TABS
50.0000 ug | ORAL_TABLET | Freq: Every day | ORAL | Status: DC
  Administered 2024-05-24: 50 ug via ORAL
  Filled 2024-05-23: qty 1

## 2024-05-23 MED ORDER — ATROPINE SULFATE 1 MG/10ML IJ SOSY
PREFILLED_SYRINGE | INTRAMUSCULAR | Status: DC | PRN
  Administered 2024-05-23: 1 mg via INTRAVENOUS

## 2024-05-23 MED ORDER — TICAGRELOR 60 MG PO TABS
60.0000 mg | ORAL_TABLET | Freq: Two times a day (BID) | ORAL | Status: DC
Start: 2024-05-23 — End: 2024-05-24
  Administered 2024-05-23 – 2024-05-24 (×2): 60 mg via ORAL
  Filled 2024-05-23 (×2): qty 1

## 2024-05-23 MED ORDER — CEFAZOLIN SODIUM-DEXTROSE 2-4 GM/100ML-% IV SOLN
2.0000 g | Freq: Three times a day (TID) | INTRAVENOUS | Status: AC
  Administered 2024-05-23 – 2024-05-24 (×2): 2 g via INTRAVENOUS
  Filled 2024-05-23 (×2): qty 100

## 2024-05-23 MED ORDER — DOPAMINE-DEXTROSE 3.2-5 MG/ML-% IV SOLN
INTRAVENOUS | Status: AC
  Filled 2024-05-23: qty 250

## 2024-05-23 MED ORDER — ACETAMINOPHEN 325 MG RE SUPP: 325.0000 mg | RECTAL | Status: DC | PRN

## 2024-05-23 MED ORDER — AMLODIPINE BESYLATE 5 MG PO TABS
10.0000 mg | ORAL_TABLET | Freq: Every day | ORAL | Status: DC
  Administered 2024-05-24: 10 mg via ORAL
  Filled 2024-05-23: qty 2

## 2024-05-23 MED ORDER — HEPARIN SODIUM (PORCINE) 1000 UNIT/ML IJ SOLN
INTRAMUSCULAR | Status: DC | PRN
  Administered 2024-05-23: 8000 [IU] via INTRAVENOUS

## 2024-05-23 MED ORDER — CEFAZOLIN SODIUM-DEXTROSE 2-4 GM/100ML-% IV SOLN
2.0000 g | INTRAVENOUS | Status: AC
  Administered 2024-05-23: 2 g via INTRAVENOUS

## 2024-05-23 SURGICAL SUPPLY — 23 items
BALLOON VTRAC 4.5X30X135 (BALLOONS) IMPLANT
CATH ANGIO 5F PIGTAIL 100CM (CATHETERS) IMPLANT
CATH BEACON 5 .035 100 H1 TIP (CATHETERS) IMPLANT
COVER DRAPE FLUORO 36X44 (DRAPES) IMPLANT
COVER PROBE ULTRASOUND 5X96 (MISCELLANEOUS) IMPLANT
DEVICE EMBOSHIELD NAV6 4.0-7.0 (FILTER) IMPLANT
DEVICE PRESTO INFLATION (MISCELLANEOUS) IMPLANT
DEVICE STARCLOSE SE CLOSURE (Vascular Products) IMPLANT
DEVICE TORQUE (MISCELLANEOUS) IMPLANT
GLIDEWIRE ANGLED SS 035X260CM (WIRE) IMPLANT
KIT CAROTID MANIFOLD (MISCELLANEOUS) IMPLANT
NDL ENTRY 21GA 7CM ECHOTIP (NEEDLE) IMPLANT
NEEDLE ENTRY 21GA 7CM ECHOTIP (NEEDLE) ×1 IMPLANT
PACK ANGIOGRAPHY (CUSTOM PROCEDURE TRAY) ×1 IMPLANT
SET INTRO CAPELLA COAXIAL (SET/KITS/TRAYS/PACK) IMPLANT
SHEATH BRITE TIP 6FRX11 (SHEATH) IMPLANT
SHEATH BRITE TIP 8FRX11 (SHEATH) IMPLANT
SHEATH SHUTTLE 6FRX80 (SHEATH) IMPLANT
STENT XACT CAR 9-7X40X136 (Permanent Stent) IMPLANT
SYR MEDRAD MARK 7 150ML (SYRINGE) IMPLANT
TUBING CONTRAST HIGH PRESS 72 (TUBING) IMPLANT
WIRE G VAS 035X260 STIFF (WIRE) IMPLANT
WIRE J 3MM .035X145CM (WIRE) IMPLANT

## 2024-05-23 NOTE — Op Note (Signed)
 OPERATIVE NOTE DATE: 05/23/2024  PROCEDURE:  Ultrasound guidance for vascular access right femoral artery  Placement of a 9 mm proximal, 7 mm distal, 4 cm long exact stent with the use of the NAV-6 embolic protection device in the right carotid artery  PRE-OPERATIVE DIAGNOSIS: 1.  High-grade right carotid artery stenosis. 2.  Questionable proximal right common carotid artery stenosis at the origin  POST-OPERATIVE DIAGNOSIS:  Same as above with no significant stenosis in the proximal right common carotid artery  SURGEON: Selinda Gu, MD  ASSISTANT(S):  none  ANESTHESIA: local/MCS  ESTIMATED BLOOD LOSS:  25 cc  CONTRAST: 70 cc  FLUORO TIME: 5.9 minutes  MODERATE CONSCIOUS SEDATION TIME:  Approximately 41 minutes using 2 mg of Versed  and 50 mcg of Fentanyl   FINDING(S): 1.   90 to 95% proximal right carotid artery stenosis.  No significant stenosis at the right common carotid artery origin  SPECIMEN(S):   none  INDICATIONS:   Patient is a 58 y.o. female who presents with high-grade right carotid artery stenosis.  The patient has a questionable lesion at the origin of the right common carotid artery that would not be surgically accessible and carotid artery stenting was felt to be preferred to endarterectomy for that reason. I have completed Share Decision Making with Earlene A Hinson prior to surgery.  Conversations included: -Discussion of all treatment options including carotid endarterectomy (CEA), CAS (which includes transcarotid artery revascularization (TCAR)), and optimal medical therapy (OMT)). -Explanation of risks and benefits for each option specific to Brindley A Ruddick's clinical situation. -Integration of clinical guidelines as it relates to the patient's history and co-morbidities -Discussion and incorporation of Emmarae A Hannibal and their personal preferences and priorities in choosing a treatment plan.  If patient was unable to participate in Shared Decision Making  this process was done with the patient  Risks and benefits were discussed and informed consent was obtained.   DESCRIPTION: After obtaining full informed written consent, the patient was brought back to the vascular suite and placed supine upon the table.  The patient received IV antibiotics prior to induction. Moderate conscious sedation was administered during a face to face encounter with the patient throughout the procedure with my supervision of the RN administering medicines and monitoring the patients vital signs and mental status throughout from the start of the procedure until the patient was taken to the recovery room.  After obtaining adequate anesthesia, the patient was prepped and draped in the standard fashion.   The right femoral artery was visualized with ultrasound and found to be widely patent. It was then accessed under direct ultrasound guidance without difficulty with a Seldinger needle. A permanent image was recorded. A J-wire was placed and we then placed a 6 French sheath. The patient was then heparinized and a total of 8000 units of intravenous heparin were given and an ACT was checked to confirm successful anticoagulation. A pigtail catheter was then placed into the ascending aorta. This showed a bovine arch with no proximal stenosis in the great vessels. I then selectively cannulated the innominate artery without difficulty with a headhunter catheter and advanced into the mid right common carotid artery.  Of note, no significant stenosis was seen at the origin of the right common carotid artery.  Cervical and cerebral carotid angiography was then performed. There were no obvious intracranial filling defects with minimal anterior cerebral flow. The carotid bifurcation demonstrated a 90 to 95% stenosis of the proximal internal carotid artery at its origin.  I then advanced into the external carotid artery with a Glidewire and the headhunter catheter and then exchanged for the Amplatz  Super Stiff wire. Over the Amplatz Super Stiff wire, a 6 [place] shuttle sheath was placed into the mid common carotid artery. I then used the NAV-6  Embolic protection device and crossed the lesion and parked this in the distal internal carotid artery at the base of the skull.  I then selected a 9 mm proximal, 7 mm distal, 4 cm long exact stent. This was deployed across the lesion encompassing it in its entirety. A 4.5 mm diameter by 2 cm length balloon was used to post dilate the stent. Only about a 10-15% residual stenosis was present after angioplasty. Completion angiogram showed normal intracranial filling without new defects. At this point I elected to terminate the procedure. The sheath was removed and StarClose closure device was deployed in the right femoral artery with excellent hemostatic result. The patient was taken to the recovery room in stable condition having tolerated the procedure well.  COMPLICATIONS: none  CONDITION: stable  Selinda Gu 05/23/2024 9:38 AM   This note was created with Dragon Medical transcription system. Any errors in dictation are purely unintentional.

## 2024-05-23 NOTE — Progress Notes (Signed)
 Morgan Wright                                          MRN: [id number]   05/23/2024   The [facility] Quality Team Specialist reviewed this patient medical record for the purposes of chart review for care gap closure. The following were reviewed: chart review for care gap closure-controlling blood pressure.    [facility]BCI Quality Team

## 2024-05-23 NOTE — Progress Notes (Signed)
 Dr. Marea in at bedside, speaking with pt. And her daughter re: procedural results. Both verbalized understanding of conversation with MD.

## 2024-05-23 NOTE — Plan of Care (Signed)
  Problem: Education: Goal: Knowledge of discharge needs will improve Outcome: Progressing   Problem: Clinical Measurements: Goal: Postoperative complications will be avoided or minimized Outcome: Progressing   Problem: Respiratory: Goal: Will achieve and/or maintain a regular respiratory rate, without signs or symptoms of dyspnea Outcome: Progressing   Problem: Education: Goal: Knowledge of General Education information will improve Description: Including pain rating scale, medication(s)/side effects and non-pharmacologic comfort measures Outcome: Progressing   Problem: Health Behavior/Discharge Planning: Goal: Ability to manage health-related needs will improve Outcome: Progressing   Problem: Clinical Measurements: Goal: Ability to maintain clinical measurements within normal limits will improve Outcome: Progressing Goal: Respiratory complications will improve Outcome: Progressing   Problem: Activity: Goal: Risk for activity intolerance will decrease Outcome: Progressing   Problem: Nutrition: Goal: Adequate nutrition will be maintained Outcome: Progressing   Problem: Coping: Goal: Level of anxiety will decrease Outcome: Progressing   Problem: Elimination: Goal: Will not experience complications related to urinary retention Outcome: Progressing   Problem: Pain Managment: Goal: General experience of comfort will improve and/or be controlled Outcome: Progressing   Problem: Safety: Goal: Ability to remain free from injury will improve Outcome: Progressing

## 2024-05-23 NOTE — H&P (Signed)
 [facility] Admission History & Physical  MRN : [id number]  Morgan Wright is a 58 y.o. (1966/07/20) female who presents with chief complaint of No chief complaint on file. [name]  History of Present Illness: Patient presents for her carotid angiogram and likely stent placement today.  No new symptoms since her office visit about a month ago.  Current Facility-Administered Medications  Medication Dose Route Frequency Provider Last Rate Last Admin   0.9 %  sodium chloride infusion   Intravenous Continuous Brown, Fallon E, NP 75 mL/hr at 05/23/24 0801 New Bag at 05/23/24 0801   ceFAZolin (ANCEF) IVPB 2g/100 mL premix  2 g Intravenous 30 min Pre-Op Brown, Fallon E, NP       diphenhydrAMINE (BENADRYL) injection 50 mg  50 mg Intravenous Once PRN Brown, Fallon E, NP       famotidine (PEPCID) tablet 40 mg  40 mg Oral Once PRN Brown, Fallon E, NP       Heparin (Porcine) in NaCl 1000-0.9 UT/500ML-% SOLN    PRN Siennah Barrasso S, MD   3,000 mL at 05/23/24 9182   HYDROmorphone  (DILAUDID ) injection 1 mg  1 mg Intravenous Once PRN Brown, Fallon E, NP       methylPREDNISolone sodium succinate (SOLU-MEDROL) 125 mg/2 mL injection 125 mg  125 mg Intravenous Once PRN Brown, Fallon E, NP       midazolam  (VERSED ) 2 MG/ML syrup 8 mg  8 mg Oral Once PRN Brown, Fallon E, NP       ondansetron  (ZOFRAN ) injection 4 mg  4 mg Intravenous Q6H PRN Brown, Fallon E, NP        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    Past Surgical History:  Procedure Laterality Date   CARDIAC SURGERY     CESAREAN SECTION     x 3 - 1984-1994   MICROLARYNGOSCOPY W/VOCAL CORD INJECTION Bilateral  01/28/2019   Procedure: MICROLARYNGOSCOPY WITH VOCAL CORD EXCISION;  Surgeon: Herminio Miu, MD;  Location: [facility];  Service: ENT;  Laterality: Bilateral;   TONSILLECTOMY       Social History   Tobacco Use   Smoking status: Every Day    Current packs/day: 0.75    Average packs/day: 0.8 packs/day for 40.0 years (30.0 ttl pk-yrs)    Types: Cigarettes   Smokeless tobacco: Never  Vaping Use   Vaping status: Never Used  Substance Use Topics   Alcohol use: No   Drug use: Never     Family History  Problem Relation Age of Onset   Heart disease Mother    Anuerysm Father 27   Lymphoma Sister        burkit    No Known Allergies   REVIEW OF SYSTEMS (Negative unless checked)  Constitutional: [] Weight loss  [] Fever  [] Chills Cardiac: [] Chest pain   [] Chest pressure   [] Palpitations   [] Shortness of breath when laying flat   [] Shortness of breath at rest   [] Shortness of breath with exertion. Vascular:  [x] Pain in legs with walking   [] Pain in legs at rest   [] Pain in legs when laying flat   [x] Claudication   []   Pain in feet when walking  [] Pain in feet at rest  [] Pain in feet when laying flat   [] History of DVT   [] Phlebitis   [] Swelling in legs   [] Varicose veins   [] Non-healing ulcers Pulmonary:   [] Uses home oxygen   [] Productive cough   [] Hemoptysis   [] Wheeze  [] COPD   [] Asthma Neurologic:  [] Dizziness  [] Blackouts   [] Seizures   [] History of stroke   [] History of TIA  [] Aphasia   [] Temporary blindness   [] Dysphagia   [] Weakness or numbness in arms   [] Weakness or numbness in legs Musculoskeletal:  [] Arthritis   [] Joint swelling   [x] Joint pain   [] Low back pain Hematologic:  [] Easy bruising  [] Easy bleeding   [] Hypercoagulable state   [] Anemic  [] Hepatitis Gastrointestinal:  [] Blood in stool   [] Vomiting blood  [] Gastroesophageal reflux/heartburn   [] Difficulty swallowing. Genitourinary:  [x] Chronic kidney disease   [] Difficult urination  [] Frequent urination   [] Burning with urination   [] Blood in urine Skin:  [] Rashes   [] Ulcers   [] Wounds Psychological:  [] History of anxiety   []  History of major depression.  Physical Examination  Vitals:   05/23/24 0751  BP: (!) 176/96  Pulse: (!) 58  Resp: 15  Temp: (!) 96.7 F (35.9 C)  TempSrc: Tympanic  SpO2: 97%  Weight: 78 kg  Height: 5' 1 (1.549 m)   Body mass index is 32.5 kg/m. Gen: WD/WN, NAD Head: [place]/AT, No temporalis wasting.  Ear/Nose/Throat: Hearing grossly intact, nares w/o erythema or drainage, oropharynx w/o Erythema/Exudate,  Eyes: Conjunctiva clear, sclera non-icteric Neck: Trachea midline.  No JVD.  Pulmonary:  Good air movement, respirations not labored, no use of accessory muscles.  Cardiac: bradycardic Vascular:  Vessel Right Left  Radial Palpable Palpable           Musculoskeletal: M/S 5/5 throughout.  Extremities without ischemic changes.  No deformity or atrophy.  Neurologic: Sensation grossly intact in extremities.  Symmetrical.  Speech is fluent. Motor exam as listed above. Psychiatric: Judgment intact, Mood & affect appropriate for pt's clinical situation. Dermatologic: No rashes or ulcers noted.  No cellulitis or open wounds. Lymph : No Cervical, Axillary, or Inguinal lymphadenopathy.     CBC Lab Results  Component Value Date   WBC 14.2 (H) 04/22/2024   HGB 14.2 04/22/2024   HCT 42.9 04/22/2024   MCV 89.2 04/22/2024   PLT 271 04/22/2024    BMET    Component Value Date/Time   NA 137 04/22/2024 0824   NA 140 04/06/2024 1126   NA 133 (L) 06/15/2012 0915   K 4.5 04/22/2024 0824   K 4.3 06/15/2012 0915   CL 104 04/22/2024 0824   CL 98 06/15/2012 0915   CO2 24 04/22/2024 0824   CO2 26 06/15/2012 0915   GLUCOSE 94 04/22/2024 0824   GLUCOSE 166 (H) 06/15/2012 0915   BUN 19 05/23/2024 0753   BUN 13 04/06/2024 1126   BUN 8 06/15/2012 0915   CREATININE 1.29 (H) 05/23/2024 0753   CREATININE 1.27 (H) 04/22/2024 0824   CALCIUM  9.8 04/22/2024 0824    CALCIUM  9.1 06/15/2012 0915   GFRNONAA 48 (L) 05/23/2024 0753   GFRNONAA 100 03/07/2019 0809   GFRAA 116 03/07/2019 0809   Estimated Creatinine Clearance: 45 mL/min (A) (by C-G formula based on SCr of 1.29 mg/dL (H)).  COAG No results found for: INR, PROTIME  Radiology No results found.   Assessment/Plan Carotid artery stenosis her duplex last month showed critically high velocities in  the right carotid artery.  Her left carotid artery demonstrated only mild disease by duplex.  This prompted a CT angiogram which was done roughly 1 month ago.  I have independently reviewed this scan. This clearly demonstrates a very high-grade right internal carotid artery stenosis just beyond its origin.  This would have the appearance of a radiographic string sign and nearly occlusive.  The distal right internal carotid artery appears to be patent but small due to malperfusion.  The left carotid artery has very mild stenosis.  There was also question of a possible stenosis at the origin of the right common carotid artery just past the innominate artery but this may very well be due to artifact coming through the right subclavian and innominate veins.  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 This is clearly a critical situation with a high risk of stroke that could be potentially life-threatening.  Although her iliac artery disease could complicate carotid stenting somewhat, due to the questionable lesion at the origin of the right common carotid artery angiography with possible stenting would be preferred if her iliac arteries will allow this.  This would allow interrogation of this proximal lesion and potentially treatment of this as well if it is in fact a true lesion.  I have discussed that she really has a string sign stenosis and that intervention or surgery would clearly benefit her from a stroke reduction standpoint.  I have discussed the risks and benefits of carotid artery angiography and stenting.  I  discussed the differences between that and carotid endarterectomy.  Patient voices her understanding and is agreeable to proceed.  She will continue on her dual antiplatelet therapy with aspirin  and Brilinta .  She will also continue her statin agent with Crestor .     Essential hypertension blood pressure control important in reducing the progression of atherosclerotic disease. On appropriate oral medications.     Type 2 diabetes mellitus with other specified complication (HCC) blood glucose control important in reducing the progression of atherosclerotic disease. Also, involved in wound healing. On appropriate medications.     Hyperlipidemia, mixed lipid control important in reducing the progression of atherosclerotic disease. Continue statin therapy     Atherosclerotic renal artery stenosis (HCC) She also had a renal artery duplex earlier this month.  She has an occluded right renal artery.  Her duplex showed her left renal artery stent to be patent with good flow in the left renal artery.  Would plan to follow this once to twice a year with duplex.   Atherosclerosis of native arteries of extremity with intermittent claudication (HCC) Aortoiliac duplex does show what appears to be significant iliac artery disease bilaterally.  This could complicate carotid artery stenting, but I would not plan immediate intervention on this as there is not any acute limb threatening symptom and her carotid disease would take precedence.  This may be something that needs to be addressed in the future due to her claudication symptoms.   Selinda Gu, MD  05/23/2024 8:17 AM

## 2024-05-24 DIAGNOSIS — Z9889 Other specified postprocedural states: Secondary | ICD-10-CM

## 2024-05-24 DIAGNOSIS — Z95828 Presence of other vascular implants and grafts: Secondary | ICD-10-CM

## 2024-05-24 LAB — BASIC METABOLIC PANEL WITH GFR
Anion gap: 9 (ref 5–15)
BUN: 14 mg/dL (ref 6–20)
CO2: 22 mmol/L (ref 22–32)
Calcium: 9 mg/dL (ref 8.9–10.3)
Chloride: 106 mmol/L (ref 98–111)
Creatinine, Ser: 1.32 mg/dL — ABNORMAL HIGH (ref 0.44–1.00)
GFR, Estimated: 47 mL/min — ABNORMAL LOW (ref >60)
Glucose, Bld: 114 mg/dL — ABNORMAL HIGH (ref 70–99)
Potassium: 3.8 mmol/L (ref 3.5–5.1)
Sodium: 137 mmol/L (ref 135–145)

## 2024-05-24 LAB — CBC
HCT: 36 % (ref 36.0–46.0)
Hemoglobin: 12.2 g/dL (ref 12.0–15.0)
MCH: 29.5 pg (ref 26.0–34.0)
MCHC: 33.9 g/dL (ref 30.0–36.0)
MCV: 87.2 fL (ref 80.0–100.0)
Platelets: 227 K/uL (ref 150–400)
RBC: 4.13 MIL/uL (ref 3.87–5.11)
RDW: 13.3 % (ref 11.5–15.5)
WBC: 11.8 K/uL — ABNORMAL HIGH (ref 4.0–10.5)
nRBC: 0 % (ref 0.0–0.2)

## 2024-05-24 MED ORDER — LORAZEPAM 0.5 MG PO TABS
0.5000 mg | ORAL_TABLET | Freq: Two times a day (BID) | ORAL | Status: DC | PRN
  Administered 2024-05-24: 0.5 mg via ORAL
  Filled 2024-05-24: qty 1

## 2024-05-24 NOTE — Discharge Instructions (Signed)
 Vascular Surgery Discharge Instructions:  Do not lift anything heavy for the next two weeks. Do not lift anything more than a gallon of milk.  Do not drive for the next two weeks. Do not drive while taking any narcotic medication.   You may shower tomorrow Wednesday 05/25/2024 after getting home. Shower with the dressing in place. Remove immediately after showering. Pat completely dry and place a band aid over the puncture site for the next three days.   Follow up with vein and vascular surgery as scheduled.

## 2024-05-24 NOTE — TOC CM/SW Note (Signed)
 Transition of Care [facility]) - Inpatient Brief Assessment   Patient Details  Name: Aviance HILARIA TITSWORTH MRN: [id number] Date of Birth: 1965-12-14  Transition of Care [facility]) CM/SW Contact:    Alfonso Rummer, LCSW Phone Number: 05/24/2024, 10:19 AM   Clinical Narrative: KEN DELENA Rummer completed TOC chart review No TOC needs identifed. Please contact should needs arise.    Transition of Care Asessment:

## 2024-05-24 NOTE — Discharge Summary (Signed)
 [facility] VASCULAR & VEIN SPECIALISTS    Discharge Summary    Patient ID:  Morgan Wright MRN: [id number] DOB/AGE: [birth year] 58 y.o.  Admit date: 05/23/2024 Discharge date: 05/24/2024 Date of Surgery: 05/23/2024 Surgeon: Surgeon(s): [name], MD  Admission Diagnosis: Carotid stenosis, right [I65.21]  Discharge Diagnoses:  Carotid stenosis, right [I65.21]  Secondary Diagnoses: 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  She has a history of cardiac bypass with vein grafting and three-vessel disease.  Patient was noted to have on CTA 90 to 95% proximal right carotid artery stenosis and therefore required right carotid artery endovascular stent placement.  Hospital Course:  Morgan Wright is a 58 y.o. female is S/P Right Endovascular Stent placement. Patient tolerated the procedure well. Patient is recovering as expected. Patient did not require any blood pressure support or heart rate control. Patient ambulating, eating and urinating well. No complaints and vitals all remain stable. Patient to be discharged later today.   Patient is being discharged on aspirin  81 mg daily, Brilinta  90 mg daily and Crestor  40 mg daily.  Patient was placed on Brilinta  due to previous cardiac bypass history at the request of her cardiologist.  Patient was requested to take Eliquis  but she cannot afford it at this time.  Therefore aspirin , Brilinta  and Crestor  acceptable  postprocedure today.  I spent greater than 60 minutes in developing, implementing, teaching and discharging this patient today.  Extubated: POD # 0 Physical Exam:  Alert notes x3, no acute distress Face: Symmetrical.  Tongue is midline. Neck: Trachea is midline.  No swelling or bruising. Cardiovascular: Regular rate and rhythm Pulmonary: Clear to auscultation bilaterally Abdomen: Soft, nontender, nondistended Right groin access: Clean dry and intact.  No swelling or drainage noted Left lower extremity: Thigh soft.  Calf soft.  Extremities warm distally toes.  Hard to palpate pedal pulses however the foot is warm is her good capillary refill. Right lower extremity: Thigh soft.  Calf soft.  Extremities warm distally toes.  Hard to palpate pedal pulses however the foot is warm is her good capillary refill. Neurological: No deficits noted   Post-op wounds:  clean, dry, intact or healing well  Pt. Ambulating, voiding and taking PO diet without difficulty. Pt pain controlled with PO pain meds.  Labs:  As below  Complications: none  Consults:    Significant Diagnostic Studies: CBC Lab Results  Component Value Date   WBC 11.8 (H) 05/24/2024   HGB 12.2 05/24/2024   HCT 36.0 05/24/2024   MCV 87.2 05/24/2024   PLT 227 05/24/2024    BMET    Component Value Date/Time   NA 137 05/24/2024 0505   NA 140 04/06/2024 1126   NA 133 (L) 06/15/2012 0915   K 3.8 05/24/2024 0505   K 4.3 06/15/2012 0915   CL 106 05/24/2024 0505   CL 98 06/15/2012 0915   CO2 22 05/24/2024 0505   CO2 26 06/15/2012 0915   GLUCOSE 114 (  H) 05/24/2024 0505   GLUCOSE 166 (H) 06/15/2012 0915   BUN 14 05/24/2024 0505   BUN 13 04/06/2024 1126   BUN 8 06/15/2012 0915   CREATININE 1.32 (H) 05/24/2024 0505   CREATININE 1.27 (H) 04/22/2024 0824   CALCIUM  9.0 05/24/2024 0505   CALCIUM  9.1 06/15/2012 0915   GFRNONAA 47 (L) 05/24/2024 0505   GFRNONAA 100 03/07/2019 0809   GFRAA 116 03/07/2019 0809    COAG No results found for: INR, PROTIME   Disposition:  Discharge to :Home  Allergies as of 05/24/2024   No Known Allergies      Medication List     STOP taking these medications    Eliquis  5 MG Tabs tablet Generic drug: apixaban        TAKE these medications    AeroChamber MV inhaler Use as instructed   albuterol  108 (90 Base) MCG/ACT inhaler Commonly known as: VENTOLIN  HFA Inhale 2 puffs into the lungs every 4 (four) hours as needed.   amLODipine  10 MG tablet Commonly known as: NORVASC  Take 1 tablet (10 mg total) by mouth daily.   aspirin  EC 81 MG tablet Take 1 tablet (81 mg total) by mouth daily. Swallow whole.   carvedilol  25 MG tablet Commonly known as: COREG  Take 1 tablet (25 mg total) by mouth 2 (two) times daily.   hydrALAZINE  50 MG tablet Commonly known as: APRESOLINE  Take 1 tablet (50 mg total) by mouth 3 (three) times daily.   levothyroxine  75 MCG tablet Commonly known as: SYNTHROID  TAKE 1 TABLET BY MOUTH EVERY OTHER DAY AND ALTERNATE WITH 50 MCG TABLET.   levothyroxine  50 MCG tablet Commonly known as: SYNTHROID  TAKE 1 TABLET BY MOUTH EVERY OTHER DAY, ALTERNATING WITH OTHER DOSE OF 75 MCG EVERY OTHER DAY.   metFORMIN  750 MG 24 hr tablet Commonly known as: GLUCOPHAGE -XR Take 2 tablets by mouth daily with breakfast.   Nicotine  Step 1 21 MG/24HR patch Generic drug: nicotine  Place 1 patch (21 mg total) onto the skin daily.   olmesartan  40 MG tablet Commonly known as: BENICAR  Take 1 tablet (40 mg total) by mouth daily.   rosuvastatin  40 MG tablet Commonly known as: CRESTOR  Take 1 tablet (40 mg total) by mouth at bedtime.   ticagrelor  60 MG Tabs tablet Commonly known as: BRILINTA  Take 1 tablet by mouth twice daily.       Verbal and written Discharge instructions given to the patient. Wound care per Discharge AVS  Follow-up Information     Dew, Selinda RAMAN, MD Follow up in 3 week(s).   Specialties: Vascular Surgery, Radiology,  Interventional Cardiology Why: Carotid Ultrasound. Contact information: [address] [place] [address] [phone]                 Signed: Gwendlyn JONELLE Shank, NP  05/24/2024, 10:22 AM

## 2024-05-25 ENCOUNTER — Telehealth: Payer: Self-pay

## 2024-05-25 NOTE — Transitions of Care (Post Inpatient/ED Visit) (Signed)
 05/25/2024  Name: Morgan Wright MRN: [id number] DOB: 08/04/1966  Today's TOC FU Call Status: Today's TOC FU Call Status:: Successful TOC FU Call Completed TOC FU Call Complete Date: 05/25/24 Patient's Name and Date of Birth confirmed.  Transition Care Management Follow-up Telephone Call Date of Discharge: 05/24/24 Discharge Facility: [facility]) Type of Discharge: Inpatient Admission Primary Inpatient Discharge Diagnosis:: Right Endovascular Stent Placement How have you been since you were released from the hospital?: Better Any questions or concerns?: No  Items Reviewed: Did you receive and understand the discharge instructions provided?: Yes Medications obtained,verified, and reconciled?: Yes (Medications Reviewed) Any new allergies since your discharge?: No Dietary orders reviewed?: Yes Type of Diet Ordered:: Low Sodium Heart Healthy Do you have support at home?: Yes People in Home [RPT]: significant other Name of Support/Comfort Primary Source: Daughter Destiny  Medications Reviewed Today: Medications Reviewed Today     Reviewed by Moises Reusing, RN (Case Manager) on 05/25/24 at 1344  Med List Status: <None>   Medication Order Taking? Sig Documenting Provider Last Dose Status Informant  albuterol  (VENTOLIN  HFA) 108 (90 Base) MCG/ACT inhaler [id number]  Inhale 2 puffs into the lungs every 4 (four) hours as needed.  Patient not taking: Reported on 04/19/2024   Bernardino Ditch, NP  Active   amLODipine  (NORVASC ) 10 MG tablet [id number] No Take 1 tablet (10 mg total) by mouth daily. Edman Marsa PARAS, DO 05/23/2024 Morning Active   aspirin  EC 81 MG tablet [id number] No Take 1 tablet (81 mg total) by mouth daily. Swallow whole. Edman Marsa PARAS, DO 05/23/2024 Morning Active   carvedilol  (COREG ) 25 MG tablet [id number] No Take 1 tablet (25 mg total) by mouth 2 (two) times daily. Edman Marsa PARAS, DO 05/23/2024 Morning Active    hydrALAZINE  (APRESOLINE ) 50 MG tablet [id number]  Take 1 tablet (50 mg total) by mouth 3 (three) times daily. Gollan, Timothy J, MD  Active   levothyroxine  (SYNTHROID ) 50 MCG tablet [id number] No TAKE 1 TABLET BY MOUTH EVERY OTHER DAY, ALTERNATING WITH OTHER DOSE OF 75 MCG EVERY OTHER DAY. Edman Marsa PARAS, DO 05/22/2024 Active   levothyroxine  (SYNTHROID ) 75 MCG tablet [id number] No TAKE 1 TABLET BY MOUTH EVERY OTHER DAY AND ALTERNATE WITH 50 MCG TABLET. Edman Marsa PARAS, DO 05/23/2024 Morning Active   metFORMIN  (GLUCOPHAGE -XR) 750 MG 24 hr tablet [id number] No Take 2 tablets by mouth daily with breakfast. Edman Marsa PARAS, DO 05/21/2024 Active   NICOTINE  STEP 1 21 MG/24HR patch [id number]  Place 1 patch (21 mg total) onto the skin daily. Gollan, Timothy J, MD  Active   olmesartan  (BENICAR ) 40 MG tablet [id number] No Take 1 tablet (40 mg total) by mouth daily. Edman Marsa PARAS, DO 05/23/2024 Morning Active   rosuvastatin  (CRESTOR ) 40 MG tablet [id number] No Take 1 tablet (40 mg total) by mouth at bedtime. Edman Marsa PARAS, DO 05/22/2024 Active   Spacer/Aero-Holding Chambers (AEROCHAMBER MV) inhaler [id number]  Use as instructed  Patient not taking: Reported on 04/19/2024   Bernardino Ditch, NP  Active   ticagrelor  (BRILINTA ) 60 MG TABS tablet [id number] No Take 1 tablet by mouth twice daily. Gollan, Timothy J, MD 05/23/2024 Morning Active             Home Care and Equipment/Supplies: Were Home Health Services Ordered?: NA Any new equipment or medical supplies ordered?: NA  Functional Questionnaire: Do you need assistance with bathing/showering or dressing?: No Do you need assistance with meal preparation?: No Do you need assistance with  eating?: No Do you have difficulty maintaining continence: No Do you need assistance with getting out of bed/getting out of a chair/moving?: No Do you have difficulty managing or taking your medications?: No  Follow up appointments  reviewed: PCP Follow-up appointment confirmed?: Yes Date of PCP follow-up appointment?: 06/13/24 Follow-up Provider: Dr. Royden Specialist [facility] Follow-up appointment confirmed?: Yes Date of Specialist follow-up appointment?: 06/17/24 Follow-Up Specialty Provider:: Dr. Marea Do you need transportation to your follow-up appointment?: No Do you understand care options if your condition(s) worsen?: Yes-patient verbalized understanding  SDOH Interventions Today    Flowsheet Row Most Recent Value  SDOH Interventions   Food Insecurity Interventions Intervention Not Indicated  Housing Interventions Intervention Not Indicated  Transportation Interventions Intervention Not Indicated  Utilities Interventions Intervention Not Indicated    Medford Balboa, BSN, RN [place]  VBCI - [facility] RN Care Manager [phone]

## 2024-05-30 ENCOUNTER — Other Ambulatory Visit (INDEPENDENT_AMBULATORY_CARE_PROVIDER_SITE_OTHER): Admitting: Pharmacist

## 2024-05-30 DIAGNOSIS — I10 Essential (primary) hypertension: Secondary | ICD-10-CM

## 2024-05-30 DIAGNOSIS — I2510 Atherosclerotic heart disease of native coronary artery without angina pectoris: Secondary | ICD-10-CM

## 2024-05-30 DIAGNOSIS — F17210 Nicotine dependence, cigarettes, uncomplicated: Secondary | ICD-10-CM

## 2024-05-30 NOTE — Patient Instructions (Signed)
 Goals Addressed             This Visit's Progress    Pharmacy Goals       It was great talking with you today!  Check your blood pressure once daily, and any time you have concerning symptoms like headache, chest pain, dizziness, shortness of breath, or vision changes.   Our goal is less than 130/80.  To appropriately check your blood pressure, make sure you do the following:  1) Avoid caffeine, exercise, or tobacco products for 30 minutes before checking. Empty your bladder. 2) Sit with your back supported in a flat-backed chair. Rest your arm on something flat (arm of the chair, table, etc). 3) Sit still with your feet flat on the floor, resting, for at least 5 minutes.  4) Check your blood pressure. Take 1-2 readings.  5) Write down these readings and bring with you to any provider appointments.  Bring your home blood pressure machine with you to a provider's office for accuracy comparison at least once a year.   Make sure you take your blood pressure medications before you come to any office visit, even if you were asked to fast for labs.    Thank you!  Sharyle Sia, PharmD, JAQUELINE, CPP Clinical Pharmacist [facility] [phone]

## 2024-05-30 NOTE — Progress Notes (Signed)
 05/30/2024 Name: Morgan Wright MRN: [id number] DOB: 31-May-1966  Chief Complaint  Patient presents with   Medication Management   Medication Adherence    Morgan Wright is a 58 y.o. year old female who presented for a telephone visit.   They were referred to the pharmacist by their PCP for assistance in managing medication access.    Subjective:  Care Team: Primary Care Provider: Edman Marsa PARAS, DO ; Next Scheduled Visit: 06/13/2024 Cardiologist: Gollan, Timothy J, MD  Vascular Specialist: [name], MD; Next Scheduled Visit: 06/17/2024   Medication Access/Adherence  Current Pharmacy:  [email or web address] - [place], [place] - [address] [place] [address] Phone: [phone] Fax: [phone]  [facility] [address] [place] - [address] [place] [address] Phone: [phone] Fax: [phone]  [facility] [address] [place] [address] Phone: [phone] Fax: [phone]   Patient reports affordability concerns with their medications: No  Patient reports access/transportation concerns to their pharmacy: No  Patient reports adherence concerns with their medications:  No    Reports currently taking medications directly from pill bottles   PAD/ASCVD Risk Reduction/CAD   Current lipid lowering medications: rosuvastatin  40 mg daily   Antiplatelet regimen: ticagrelor  60 mg twice daily + aspirin  81 mg daily   ASCVD History: CAD/s/p CABG x 3 at [facility] in 2014; S/P Right Endovascular Stent placement 05/23/2024   Hypertension:  Current medications:  - olmesartan  40 mg daily - carvedilol  25 mg twice daily - amlodipine  10 mg daily - hydralazine  50 mg three times daily  Does not have specific home blood pressure readings to review, but recalls recent systolic readings running ~150-160. However,  unclear if patient has been resting prior to these readings and avoiding tobacco use for at least 30 minutes prior    Diabetes:  Current medications: metformin  ER 750 - 2 tablets (1,500 mg) daily with breakfast  Denies monitoring home blood sugar readings    Tobacco Abuse:  Currently smoking ~5 cigarettes/day  Currently using nicotine  patches since recent hospital discharge on 9/30. Since started patches, has cut back (previously smoking 1 pack/day)  Patient reports prefers to continue with strategy of cutting back, rather than setting quit date at this time  Motivation: my health  Barriers: length of habit  Previous therapies tried: gum (did not like taste)    Objective:  Lab Results  Component Value Date   HGBA1C 6.1 (H) 04/22/2024    Lab Results  Component Value Date   CREATININE 1.32 (H) 05/24/2024   BUN 14 05/24/2024   NA 137 05/24/2024   K 3.8 05/24/2024   CL 106 05/24/2024   CO2 22 05/24/2024    Lab Results  Component Value Date   CHOL 109 04/22/2024   HDL 35 (L) 04/22/2024   LDLCALC 50 04/22/2024   TRIG 162 (H) 04/22/2024   CHOLHDL 3.1 04/22/2024   BP Readings from Last 3 Encounters:  05/24/24 (!) 171/50  04/19/24 (!) 183/74  04/01/24 (!) 152/68   Pulse Readings from Last 3 Encounters:  05/24/24 (!) 58  04/19/24 (!) 59  04/01/24 (!) 58     Medications Reviewed Today     Reviewed by [name], RPH-CPP (Pharmacist) on 05/30/24 at 1026  Med List Status: <None>   Medication Order Taking? Sig Documenting Provider Last Dose Status Informant  Patient not taking:   Discontinued 05/30/24 1016 (Patient Preference)   amLODipine  (NORVASC ) 10 MG tablet [id number] Yes Take 1 tablet (10 mg total) by mouth daily. Edman Marsa PARAS, DO  Active   aspirin  EC 81 MG tablet [id number] Yes Take 1 tablet (81 mg total) by mouth daily. Swallow whole. Edman Marsa PARAS, DO  Active   carvedilol  (COREG ) 25 MG tablet [id number] Yes Take 1  tablet (25 mg total) by mouth 2 (two) times daily. Edman Marsa PARAS, DO  Active   hydrALAZINE  (APRESOLINE ) 50 MG tablet [id number] Yes Take 1 tablet (50 mg total) by mouth 3 (three) times daily. Gollan, Timothy J, MD  Active   levothyroxine  (SYNTHROID ) 50 MCG tablet [id number] Yes TAKE 1 TABLET BY MOUTH EVERY OTHER DAY, ALTERNATING WITH OTHER DOSE OF 75 MCG EVERY OTHER DAY. Edman Marsa PARAS, DO  Active   levothyroxine  (SYNTHROID ) 75 MCG tablet [id number] Yes TAKE 1 TABLET BY MOUTH EVERY OTHER DAY AND ALTERNATE WITH 50 MCG TABLET. Edman Marsa PARAS, DO  Active   metFORMIN  (GLUCOPHAGE -XR) 750 MG 24 hr tablet [id number] Yes Take 2 tablets by mouth daily with breakfast. Edman Marsa PARAS, DO  Active   NICOTINE  STEP 1 21 MG/24HR patch [id number]  Place 1 patch (21 mg total) onto the skin daily. Gollan, Timothy J, MD  Active   olmesartan  (BENICAR ) 40 MG tablet [id number] Yes Take 1 tablet (40 mg total) by mouth daily. Edman Marsa PARAS, DO  Active   rosuvastatin  (CRESTOR ) 40 MG tablet [id number]  Take 1 tablet (40 mg total) by mouth at bedtime. Edman Marsa PARAS, DO  Active   Spacer/Aero-Holding Chambers (AEROCHAMBER MV) inhaler [id number]  Use as instructed  Patient not taking: Reported on 04/19/2024   Bernardino Ditch, NP  Active   ticagrelor  (BRILINTA ) 60 MG TABS tablet [id number] Yes Take 1 tablet by mouth twice daily. Gollan, Timothy J, MD  Active               Assessment/Plan:   Recommend patient restart using weekly pillbox as adherence aid  Comprehensive medication review performed; medication list updated in electronic medical record - Reports had been taking a potassium containing OTC supplement last week, but does not have this on her today.   Advise patient that if she continues to take this to bring bottle to upcoming appointment - Counsel patient on importance of taking levothyroxine  doses consistently in the morning on an empty stomach at least 30  before food  PAD/ASCVD Risk Reduction/CAD - Discuss importance of adherence to antiplatelet regimen as prescribed by Vascular Surgeon     Tobacco Abuse - Currently uncontrolled - Provided motivational interviewing to assess tobacco use and strategies for reduction - Patient currently using nicotine  patch: 21 mg daily Review Patch Schedule for >10 cigarettes daily: Apply one 21 mg patch daily for 6 weeks. Then, reduce to one 14 mg patch daily for another 2 weeks, if able. Then, reduce to one 7 mg patch for another 2 weeks, if able.     Hypertension: - Reviewed long term cardiovascular and renal outcomes of uncontrolled blood pressure - Reviewed appropriate blood pressure monitoring technique, including importance of monitoring at least 30 minutes after nicotine  use and importance of resting prior to readings - Recommended to check home blood pressure and heart rate, keep log of results and have this record to review at upcoming medical appointments. Patient to contact provider office sooner if needed for readings outside of established parameters or symptoms    Follow  Up Plan: Clinical Pharmacist will follow up with patient by telephone on 07/11/2024 at 10:00 AM    Sharyle Sia, PharmD, JAQUELINE, CPP Clinical Pharmacist [facility] [phone]

## 2024-06-02 ENCOUNTER — Other Ambulatory Visit: Payer: Self-pay | Admitting: Family Medicine

## 2024-06-02 DIAGNOSIS — I10 Essential (primary) hypertension: Secondary | ICD-10-CM

## 2024-06-03 ENCOUNTER — Encounter (INDEPENDENT_AMBULATORY_CARE_PROVIDER_SITE_OTHER): Payer: Self-pay | Admitting: Vascular Surgery

## 2024-06-03 ENCOUNTER — Encounter (INDEPENDENT_AMBULATORY_CARE_PROVIDER_SITE_OTHER): Payer: Self-pay

## 2024-06-03 NOTE — Telephone Encounter (Signed)
 Requested Prescriptions  Pending Prescriptions Disp Refills   carvedilol  (COREG ) 25 MG tablet [Pharmacy Med Name: Carvedilol  25 MG Oral Tablet] 180 tablet 0    Sig: Take 1 tablet by mouth twice daily     Cardiovascular: Beta Blockers 3 Failed - 06/03/2024  3:48 PM      Failed - Cr in normal range and within 360 days    Creat  Date Value Ref Range Status  04/22/2024 1.27 (H) 0.50 - 1.03 mg/dL Final   Creatinine, Ser  Date Value Ref Range Status  05/24/2024 1.32 (H) 0.44 - 1.00 mg/dL Final   Creatinine, Urine  Date Value Ref Range Status  04/22/2024 24 20 - 275 mg/dL Final         Failed - Last BP in normal range    BP Readings from Last 1 Encounters:  05/24/24 (!) 171/50         Passed - AST in normal range and within 360 days    AST  Date Value Ref Range Status  04/22/2024 19 10 - 35 U/L Final   SGOT(AST)  Date Value Ref Range Status  06/15/2012 14 (L) 15 - 37 Unit/L Final         Passed - ALT in normal range and within 360 days    ALT  Date Value Ref Range Status  04/22/2024 19 6 - 29 U/L Final   SGPT (ALT)  Date Value Ref Range Status  06/15/2012 25 12 - 78 U/L Final         Passed - Last Heart Rate in normal range    Pulse Readings from Last 1 Encounters:  05/24/24 (!) 58         Passed - Valid encounter within last 6 months    Recent Outpatient Visits           5 months ago Type 2 diabetes mellitus with other specified complication, without long-term current use of insulin [facility])   [facility] [place], Marsa PARAS, [place]

## 2024-06-06 ENCOUNTER — Telehealth (INDEPENDENT_AMBULATORY_CARE_PROVIDER_SITE_OTHER): Payer: Self-pay | Admitting: Vascular Surgery

## 2024-06-06 ENCOUNTER — Encounter (INDEPENDENT_AMBULATORY_CARE_PROVIDER_SITE_OTHER): Payer: Self-pay

## 2024-06-06 NOTE — Telephone Encounter (Signed)
 Patient called stating that she can't see the return to work letter that was supposed to be in [place]. Could someone please resend for her? I see Jessica's that was voided but it does have the lift restrictions on it. Not sure if those are supposed to be on there or not? Patient states she needs it for work - was unable to return today due to no note. Thanks!

## 2024-06-06 NOTE — Telephone Encounter (Signed)
 Patient called again stating she was unable to obtain letter via email, recreated work note and forwarded to patient via my chart patient confirms she has received both copies.

## 2024-06-06 NOTE — Telephone Encounter (Signed)
 Scanned to patient @ [email or web address]

## 2024-06-13 ENCOUNTER — Encounter: Payer: Self-pay | Admitting: Family Medicine

## 2024-06-13 ENCOUNTER — Ambulatory Visit (INDEPENDENT_AMBULATORY_CARE_PROVIDER_SITE_OTHER): Admitting: Family Medicine

## 2024-06-13 VITALS — BP 138/70 | HR 67 | Ht 61.0 in | Wt 174.5 lb

## 2024-06-13 DIAGNOSIS — E782 Mixed hyperlipidemia: Secondary | ICD-10-CM

## 2024-06-13 DIAGNOSIS — I10 Essential (primary) hypertension: Secondary | ICD-10-CM

## 2024-06-13 DIAGNOSIS — F17210 Nicotine dependence, cigarettes, uncomplicated: Secondary | ICD-10-CM

## 2024-06-13 DIAGNOSIS — Z951 Presence of aortocoronary bypass graft: Secondary | ICD-10-CM

## 2024-06-13 DIAGNOSIS — I2510 Atherosclerotic heart disease of native coronary artery without angina pectoris: Secondary | ICD-10-CM

## 2024-06-13 DIAGNOSIS — Z0000 Encounter for general adult medical examination without abnormal findings: Secondary | ICD-10-CM

## 2024-06-13 DIAGNOSIS — Z1211 Encounter for screening for malignant neoplasm of colon: Secondary | ICD-10-CM

## 2024-06-13 DIAGNOSIS — E1169 Type 2 diabetes mellitus with other specified complication: Secondary | ICD-10-CM | POA: Diagnosis not present

## 2024-06-13 DIAGNOSIS — E039 Hypothyroidism, unspecified: Secondary | ICD-10-CM

## 2024-06-13 DIAGNOSIS — Z23 Encounter for immunization: Secondary | ICD-10-CM

## 2024-06-13 DIAGNOSIS — N1831 Chronic kidney disease, stage 3a: Secondary | ICD-10-CM

## 2024-06-13 DIAGNOSIS — Z7984 Long term (current) use of oral hypoglycemic drugs: Secondary | ICD-10-CM

## 2024-06-13 MED ORDER — METFORMIN HCL ER 750 MG PO TB24: 1500.0000 mg | ORAL_TABLET | Freq: Every day | ORAL | 3 refills | Status: AC

## 2024-06-13 MED ORDER — LEVOTHYROXINE SODIUM 50 MCG PO TABS: ORAL_TABLET | ORAL | 3 refills | Status: AC

## 2024-06-13 MED ORDER — OLMESARTAN MEDOXOMIL 40 MG PO TABS: 40.0000 mg | ORAL_TABLET | Freq: Every day | ORAL | 3 refills | Status: AC

## 2024-06-13 MED ORDER — ROSUVASTATIN CALCIUM 40 MG PO TABS: 40.0000 mg | ORAL_TABLET | Freq: Every day | ORAL | 3 refills | Status: AC

## 2024-06-13 MED ORDER — CARVEDILOL 25 MG PO TABS: 25.0000 mg | ORAL_TABLET | Freq: Two times a day (BID) | ORAL | 3 refills | Status: AC

## 2024-06-13 MED ORDER — LEVOTHYROXINE SODIUM 75 MCG PO TABS: ORAL_TABLET | ORAL | 3 refills | Status: AC

## 2024-06-13 MED ORDER — AMLODIPINE BESYLATE 10 MG PO TABS: 10.0000 mg | ORAL_TABLET | Freq: Every day | ORAL | 3 refills | Status: AC

## 2024-06-13 NOTE — Progress Notes (Signed)
 Subjective:    Patient ID: Morgan Wright, female    DOB: [birth year], 58 y.o.   MRN: [id number]  Morgan Wright is a 58 y.o. female presenting on 06/13/2024 for Annual Exam   HPI  Discussed the use of AI scribe software for clinical note transcription with the patient, who gave verbal consent to proceed.  History of Present Illness   Morgan Wright is a 58 year old female who presents for a wellness prevention physical exam.  PAD Carotid Stenosis Followed by [facility] Dr Marea Elective surgery 9/29-9/30, R Carotid artery stenosis / stent placement - Recent right carotid artery stent placement by vascular surgery for stenosis - Post-procedure, experienced transient sensation of feeling 'loopy' for several days - Currently taking aspirin  81 and Brilinta  for antiplatelet therapy - On statin therapy  Coronary artery disease and cardiac history - History of cardiac bypass surgery 2010 [facility] - Current medications include aspirin , Brilinta , Crestor , amlodipine , carvedilol , olmesartan , and rosuvastatin    Chronic kidney disease IIIa - History of chronic kidney disease Last creatinine mild elevated and stable 1.2 range  Hypothyroidism - Alternates between 75 mg and 50 mg of levothyroxine  for thyroid  management - TSH is elevated  Dyslipidemia - Recent blood work in August showed LDL of 50 On Rosuvastatin   Tobacco use and lung cancer screening - History of tobacco use, currently smoking less than before - Due for lung cancer screening  Immunization status and preventive care - Received influenza vaccine - Considering pneumonia vaccine, especially after grandson had pneumonia - Plans to schedule mammogram - Due for Cologuard test; previous kit expired and bowel habits affected completion  Neurological symptoms and upper extremity paresthesia - History of trigger finger, treated in May - Current symptoms of tingling and numbness in fingers     Dr Kathlynn Glenn  Ortho 12/2023 She has some numbness, tested carpal tunnel R hand, median nerve symptoms, she was told to wear brace at night   Type 2 Diabetes A1c today 6.1, elevated from 5.9 but overall improved from >6.5-8 in past CBGs:Controlled  Meds: Metformin  XR 750 x 2 = 1500 daily with supper Currently on ARB Lifestyle: - Diet - no longer consuming any soda. Reduce carb starch sugar - Exercise (limited currently) - Goal to see Eye Doctor for diabetic exam Denies hypoglycemia, polyuria, visual changes, numbness or tingling.   CHRONIC HYPERTENSION /  Elevated BP in office, not checking regularly at home. Her blood pressure was high today, which she attributes to a stressful day involving her car breaking down. She is currently taking amlodipine  10 mg and carvedilol  25 mg twice daily for hypertension. She used to take a diuretic but stopped due to low sodium levels. No headache or vision changes associated with her blood pressure. Current Meds - Amlodipine  10mg  daily, Carvedilol  25 TWICE A DAY, Olmesartan  40mg , Hydralazine  50mg  TID Cannot take hydrochlorothiazide/chlorthalidone  Denies CP, dyspnea, HA, edema, dizziness / lightheadedness      12/09/2023    2:33 PM 07/04/2022    2:56 PM 03/26/2022    1:20 PM  Depression screen PHQ 2/9  Decreased Interest 0 0 0  Down, Depressed, Hopeless 0 0 0  PHQ - 2 Score 0 0 0  Altered sleeping 0 3 0  Tired, decreased energy 0 3 0  Change in appetite 0 0 0  Feeling bad or failure about yourself  0 0 0  Trouble concentrating 0 0 0  Moving slowly or fidgety/restless 0 2   Suicidal thoughts 0  0 0  PHQ-9 Score 0 8 0  Difficult doing work/chores  Very difficult Not difficult at all       12/09/2023    2:33 PM 07/04/2022    2:56 PM 03/26/2022    1:20 PM 11/05/2021    3:38 PM  GAD 7 : Generalized Anxiety Score  Nervous, Anxious, on Edge 0 0 0 0  Control/stop worrying 0 0 0 0  Worry too much - different things 0 0 0 0  Trouble relaxing 0 0 0 0  Restless 0 0  0 0  Easily annoyed or irritable 0 0 0 0  Afraid - awful might happen 0 0 0 0  Total GAD 7 Score 0 0 0 0  Anxiety Difficulty  Not difficult at all Not difficult at all Not difficult at all     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   Past Surgical History:  Procedure Laterality Date   CARDIAC SURGERY     CAROTID PTA/STENT INTERVENTION Right 05/23/2024   Procedure: CAROTID PTA/STENT INTERVENTION;  Surgeon: [name], MD;  Location: [facility];  Service: Cardiovascular;  Laterality: Right;   CESAREAN SECTION     x 3 - 1984-1994   MICROLARYNGOSCOPY W/VOCAL CORD INJECTION Bilateral 01/28/2019   Procedure: MICROLARYNGOSCOPY WITH VOCAL CORD EXCISION;  Surgeon: Herminio Miu, MD;  Location: [facility];  Service: ENT;  Laterality: Bilateral;   TONSILLECTOMY     Social History   Socioeconomic History   Marital status: Single    Spouse name: Not on file   Number of children: Not on file   Years of education: High School   Highest education level: High school graduate  Occupational History   Occupation: Shipping/Receiving    Comment: [employer] [place]  Tobacco Use   Smoking status: Every Day    Current packs/day: 0.75    Average packs/day: 0.8 packs/day for 40.0 years (30.0 ttl pk-yrs)    Types: Cigarettes   Smokeless tobacco: Never  Vaping Use   Vaping status: Never Used  Substance and Sexual Activity   Alcohol use: No   Drug use: Never   Sexual activity: Not on file  Other Topics Concern   Not on file  Social History Narrative   Not on file   Social Drivers of Health   Financial Resource Strain: Low Risk  (01/14/2024)    [facility] from [facility]   Overall Financial Resource Strain (CARDIA)    Difficulty of Paying Living Expenses: Not hard at all  Food Insecurity: No Food Insecurity (05/25/2024)   Hunger Vital Sign    Worried About Running Out of Food in the Last Year: Never true    Ran Out of Food in the Last Year: Never true  Transportation Needs: No Transportation Needs (05/25/2024)   PRAPARE - Administrator, Civil Service (Medical): No    Lack of Transportation (Non-Medical): No  Physical Activity: Not on file  Stress: Not on file  Social Connections: Not on file  Intimate Partner Violence: Not At Risk (05/25/2024)   Humiliation, Afraid, Rape, and Kick questionnaire    Fear of  Current or Ex-Partner: No    Emotionally Abused: No    Physically Abused: No    Sexually Abused: No   Family History  Problem Relation Age of Onset   Heart disease Mother    Anuerysm Father 96   Lymphoma Sister        burkit   Current Outpatient Medications on File Prior to Visit  Medication Sig   aspirin  EC 81 MG tablet Take 1 tablet (81 mg total) by mouth daily. Swallow whole.   hydrALAZINE  (APRESOLINE ) 50 MG tablet Take 1 tablet (50 mg total) by mouth 3 (three) times daily.   Spacer/Aero-Holding Chambers (AEROCHAMBER MV) inhaler Use as instructed   ticagrelor  (BRILINTA ) 60 MG TABS tablet Take 1 tablet by mouth twice daily.   No current facility-administered medications on file prior to visit.    Review of Systems  Constitutional:  Negative for activity change, appetite change, chills, diaphoresis, fatigue and fever.  HENT:  Negative for congestion and hearing loss.   Eyes:  Negative for visual disturbance.  Respiratory:  Negative for cough, chest tightness, shortness of breath and wheezing.   Cardiovascular:  Negative for chest pain, palpitations and leg swelling.  Gastrointestinal:  Negative for abdominal pain, constipation, diarrhea, nausea and vomiting.  Genitourinary:  Negative  for dysuria, frequency and hematuria.  Musculoskeletal:  Negative for arthralgias and neck pain.  Skin:  Negative for rash.  Neurological:  Negative for dizziness, weakness, light-headedness, numbness and headaches.  Hematological:  Negative for adenopathy.  Psychiatric/Behavioral:  Negative for behavioral problems, dysphoric mood and sleep disturbance.    Per HPI unless specifically indicated above     Objective:    BP 138/70 (BP Location: Left Arm, Cuff Size: Normal)   Pulse 67   Ht 5' 1 (1.549 m)   Wt 174 lb 8 oz (79.2 kg)   SpO2 95%   BMI 32.97 kg/m   Wt Readings from Last 3 Encounters:  06/13/24 174 lb 8 oz (79.2 kg)  05/23/24 172 lb (78 kg)  04/19/24 175 lb (79.4 kg)    Physical Exam Vitals and nursing note reviewed.  Constitutional:      General: She is not in acute distress.    Appearance: She is well-developed. She is not diaphoretic.     Comments: Well-appearing, comfortable, cooperative  HENT:     Head: Normocephalic and atraumatic.  Eyes:     General:        Right eye: No discharge.        Left eye: No discharge.     Conjunctiva/sclera: Conjunctivae normal.     Pupils: Pupils are equal, round, and reactive to light.  Neck:     Thyroid : No thyromegaly.     Vascular: Carotid bruit (L>R) present.  Cardiovascular:     Rate and Rhythm: Normal rate and regular rhythm.     Pulses: Normal pulses.     Heart sounds: Normal heart sounds. No murmur heard. Pulmonary:     Effort: Pulmonary effort is normal. No respiratory distress.     Breath sounds: Normal breath sounds. No wheezing or rales.  Abdominal:     General: Bowel sounds are normal. There is no distension.     Palpations: Abdomen is soft. There is no mass.     Tenderness: There is no abdominal tenderness.  Musculoskeletal:        General: No tenderness. Normal range of motion.     Cervical back: Normal range of motion and neck supple.  Right lower leg: No edema.     Left lower leg: No edema.      Comments: Upper / Lower Extremities: - Normal muscle tone, strength bilateral upper extremities 5/5, lower extremities 5/5  Lymphadenopathy:     Cervical: No cervical adenopathy.  Skin:    General: Skin is warm and dry.     Findings: No erythema or rash.  Neurological:     Mental Status: She is alert and oriented to person, place, and time.     Comments: Distal sensation intact to light touch all extremities  Psychiatric:        Mood and Affect: Mood normal.        Behavior: Behavior normal.        Thought Content: Thought content normal.     Comments: Well groomed, good eye contact, normal speech and thoughts     Recent Labs    12/09/23 1518 04/22/24 0824  HGBA1C 5.9* 6.1*     Results for orders placed or performed during the hospital encounter of 05/23/24  BUN   Collection Time: 05/23/24  7:53 AM  Result Value Ref Range   BUN 19 6 - 20 mg/dL  Creatinine, serum   Collection Time: 05/23/24  7:53 AM  Result Value Ref Range   Creatinine, Ser 1.29 (H) 0.44 - 1.00 mg/dL   GFR, Estimated 48 (L) >60 mL/min  Glucose, capillary   Collection Time: 05/23/24  7:55 AM  Result Value Ref Range   Glucose-Capillary 116 (H) 70 - 99 mg/dL  POCT Activated clotting time   Collection Time: 05/23/24  8:55 AM  Result Value Ref Range   Activated Clotting Time 302 seconds  Glucose, capillary   Collection Time: 05/23/24  9:26 AM  Result Value Ref Range   Glucose-Capillary 130 (H) 70 - 99 mg/dL  Glucose, capillary   Collection Time: 05/23/24 10:48 AM  Result Value Ref Range   Glucose-Capillary 110 (H) 70 - 99 mg/dL  MRSA Next Gen by PCR, Nasal   Collection Time: 05/23/24 10:55 AM   Specimen: Nasal Mucosa; Nasal Swab  Result Value Ref Range   MRSA by PCR Next Gen NOT DETECTED NOT DETECTED  CBC   Collection Time: 05/24/24  5:05 AM  Result Value Ref Range   WBC 11.8 (H) 4.0 - 10.5 K/uL   RBC 4.13 3.87 - 5.11 MIL/uL   Hemoglobin 12.2 12.0 - 15.0 g/dL   HCT 63.9 63.9 - 53.9 %   MCV  87.2 80.0 - 100.0 fL   MCH 29.5 26.0 - 34.0 pg   MCHC 33.9 30.0 - 36.0 g/dL   RDW 86.6 88.4 - 84.4 %   Platelets 227 150 - 400 K/uL   nRBC 0.0 0.0 - 0.2 %  Basic metabolic panel   Collection Time: 05/24/24  5:05 AM  Result Value Ref Range   Sodium 137 135 - 145 mmol/L   Potassium 3.8 3.5 - 5.1 mmol/L   Chloride 106 98 - 111 mmol/L   CO2 22 22 - 32 mmol/L   Glucose, Bld 114 (H) 70 - 99 mg/dL   BUN 14 6 - 20 mg/dL   Creatinine, Ser 8.67 (H) 0.44 - 1.00 mg/dL   Calcium  9.0 8.9 - 10.3 mg/dL   GFR, Estimated 47 (L) >60 mL/min   Anion gap 9 5 - 15      Assessment & Plan:   Problem List Items Addressed This Visit     Acquired hypothyroidism   Relevant Medications   levothyroxine  (  SYNTHROID ) 75 MCG tablet   levothyroxine  (SYNTHROID ) 50 MCG tablet   carvedilol  (COREG ) 25 MG tablet   CAD (coronary artery disease)   Relevant Medications   amLODipine  (NORVASC ) 10 MG tablet   carvedilol  (COREG ) 25 MG tablet   olmesartan  (BENICAR ) 40 MG tablet   rosuvastatin  (CRESTOR ) 40 MG tablet   Cigarette nicotine  dependence without complication   Relevant Orders   Ambulatory Referral Lung Cancer Screening [facility]   Essential hypertension   Relevant Medications   amLODipine  (NORVASC ) 10 MG tablet   carvedilol  (COREG ) 25 MG tablet   olmesartan  (BENICAR ) 40 MG tablet   rosuvastatin  (CRESTOR ) 40 MG tablet   S/P CABG x 3   Stage 3a chronic kidney disease (HCC)   Type 2 diabetes mellitus with other specified complication (HCC)   Relevant Medications   metFORMIN  (GLUCOPHAGE -XR) 750 MG 24 hr tablet   olmesartan  (BENICAR ) 40 MG tablet   rosuvastatin  (CRESTOR ) 40 MG tablet   Other Visit Diagnoses       Annual physical exam    -  Primary     Flu vaccine need       Relevant Orders   Flu vaccine trivalent PF, 6mos and older(Flulaval,Afluria,Fluarix,Fluzone) (Completed)     Screening for colon cancer       Relevant Orders   Cologuard     Need for Streptococcus pneumoniae vaccination        Relevant Orders   Pneumococcal conjugate vaccine 20-valent (Completed)     Mixed hyperlipidemia       Relevant Medications   amLODipine  (NORVASC ) 10 MG tablet   carvedilol  (COREG ) 25 MG tablet   olmesartan  (BENICAR ) 40 MG tablet   rosuvastatin  (CRESTOR ) 40 MG tablet        Updated Health Maintenance information Reviewed recent lab results with patient Encouraged improvement to lifestyle with diet and exercise Goal of weight loss  Right carotid artery stenosis, post-stent placement Post-stent placement in the right carotid artery. On aspirin , Brilinta , and Crestor  to maintain patency and prevent thrombosis. - Continue aspirin , Brilinta , and Crestor . - Follow up with vascular surgeon for ultrasound on Thursday and review on Friday.  Coronary artery disease CAD s/p CABG Followed by Cardiology Coronary artery disease post-cardiac bypass. Managed with aspirin , Brilinta , and Crestor . - Continue current medications: aspirin , Brilinta , and Crestor .  Type 2 diabetes mellitus Type 2 diabetes mellitus with A1c of 6.1, improved from past levels. No soda consumption reported. - Continue metformin . - Schedule follow-up for glucose check in six months.  Chronic kidney disease IIIa Chronic kidney disease with stable creatinine. Blood sugar and pressure control essential. - Monitor kidney function regularly. - Ensure adequate hydration.  Hypertension W/ CKD Hypertension managed with amlodipine , carvedilol , and olmesartan . - Continue amlodipine , carvedilol , and olmesartan .  Hyperlipidemia Hyperlipidemia controlled with rosuvastatin . LDL at 50. - Continue rosuvastatin .  Hypothyroidism Hypothyroidism managed with alternating levothyroxine  doses. TSH elevated, T4 within range. - Continue alternating levothyroxine  doses. 50 and 75 - Renew levothyroxine  prescription.  Tobacco use disorder Continued tobacco use, though reduced. Discussed lung cancer screening. - Recommend annual  lung cancer screening with low-dose CT scan.  Adult Wellness Visit Routine wellness visit. Discussed health maintenance, screenings, and vaccinations. - Administer Prevnar 20 vaccine. - Administer flu vaccine. - Order mammogram. - Order Cologuard test. - Discuss lung cancer screening with low-dose CT scan. - Discuss diabetic eye exam.  Trigger finger, left hand, mild Mild trigger finger in left hand.  Carpal tunnel syndrome, unspecified upper limb Carpal tunnel syndrome  with mild symptoms. Managed with night brace. - Continue using a brace at night.         Orders Placed This Encounter  Procedures   Flu vaccine trivalent PF, 6mos and older(Flulaval,Afluria,Fluarix,Fluzone)   Pneumococcal conjugate vaccine 20-valent   Cologuard   Ambulatory Referral Lung Cancer Screening [facility]    Referral Priority:   Routine    Referral Type:   Consultation    Referral Reason:   Specialty Services Required    Number of Visits Requested:   1    Meds ordered this encounter  Medications   metFORMIN  (GLUCOPHAGE -XR) 750 MG 24 hr tablet    Sig: Take 2 tablets (1,500 mg total) by mouth daily with breakfast.    Dispense:  180 tablet    Refill:  3   levothyroxine  (SYNTHROID ) 75 MCG tablet    Sig: TAKE 1 TABLET BY MOUTH EVERY OTHER DAY AND ALTERNATE WITH 50 MCG TABLET.    Dispense:  45 tablet    Refill:  3    Add future refills   levothyroxine  (SYNTHROID ) 50 MCG tablet    Sig: TAKE 1 TABLET BY MOUTH EVERY OTHER DAY, ALTERNATING WITH OTHER DOSE OF 75 MCG EVERY OTHER DAY.    Dispense:  45 tablet    Refill:  3    Add future refills   amLODipine  (NORVASC ) 10 MG tablet    Sig: Take 1 tablet (10 mg total) by mouth daily.    Dispense:  90 tablet    Refill:  3    Add future refills   carvedilol  (COREG ) 25 MG tablet    Sig: Take 1 tablet (25 mg total) by mouth 2 (two) times daily.    Dispense:  180 tablet    Refill:  3   olmesartan  (BENICAR ) 40 MG tablet    Sig: Take 1 tablet (40  mg total) by mouth daily.    Dispense:  90 tablet    Refill:  3    Add future refills   rosuvastatin  (CRESTOR ) 40 MG tablet    Sig: Take 1 tablet (40 mg total) by mouth at bedtime.    Dispense:  90 tablet    Refill:  3    Add future refills     Follow up plan: Return in about 6 months (around 12/12/2024) for 6 month DM A1c.  Marsa Officer, DO [facility] 06/13/2024, 4:32 PM

## 2024-06-13 NOTE — Patient Instructions (Addendum)
 Thank you for coming to the office today.  Refills all sent.  Prevnar 20 today good for 5 years  Flu Shot today good for 1 year  ----------------------  Your provider would like to you have your annual eye exam. Please contact your current eye doctor or here are some good options for you to contact.   [facility]   Address: [address] [place], [place] [address] Phone: [phone]  Website: [email or web address]   [facility] [address] [place], [place] [address] Phone: [phone] [email or web address]  [facility]  Address: [address] [place], [place], [place] [address] Phone: [phone]   [facility] [address] [place], [place] [address] Phone: [phone]  [facility] Address: [address] [place], [place], [place] [address]  Phone: [phone]  ---------------------------------  For Mammogram screening for breast cancer   Call the Imaging Center below anytime to schedule your own appointment now that order has been placed.  [facility] at [facility] [address], [address] [place], [place] [address] Phone: [phone]  ---------------------------------------------------------  Colon Cancer Screening: Ordered the Cologuard (home kit) test for colon cancer screening. Stay tuned for further updates.  It will be shipped to you directly. If not received in 2-4 weeks, call us  or the company.   If you send it back and no results are received in 2-4 weeks, call us  or the company as well!   Colon Cancer Screening: - For all adults age 89+ routine colon cancer screening is highly recommended.     - Recent guidelines from American Cancer Society recommend starting age of 60 - Early detection of colon cancer is important, because often there are no warning signs or symptoms, also if found early usually it can be cured. Late stage is hard to treat.   - If Cologuard is  NEGATIVE, then it is good for 3 years before next due - If Cologuard is POSITIVE, then it is strongly advised to get a Colonoscopy, which allows the GI doctor to locate the source of the cancer or polyp (even very early stage) and treat it by removing it. ------------------------- Follow instructions to collect sample, you may call the company for any help or questions, 24/7 telephone support at [phone].  ------------------------------------------------------------------   Referral sent to Lung Cancer Screening Program - CT Scan of lungs once per year, they will call you!  [facility] at [facility] [address] [place],  [place]  [address] Main: [phone]  Please schedule a Follow-up Appointment to: Return in about 6 months (around 12/12/2024) for 6 month DM A1c.  If you have any other questions or concerns, please feel free to call the office or send a message through MyChart. You may also schedule an earlier appointment if necessary.  Additionally, you may be receiving a survey about your experience at our office within a few days to 1 week by e-mail or mail. We value your feedback.  Marsa Officer, DO [facility], [place]

## 2024-06-14 ENCOUNTER — Other Ambulatory Visit (INDEPENDENT_AMBULATORY_CARE_PROVIDER_SITE_OTHER): Payer: Self-pay | Admitting: Vascular Surgery

## 2024-06-14 DIAGNOSIS — I6523 Occlusion and stenosis of bilateral carotid arteries: Secondary | ICD-10-CM

## 2024-06-16 ENCOUNTER — Ambulatory Visit (INDEPENDENT_AMBULATORY_CARE_PROVIDER_SITE_OTHER)

## 2024-06-16 DIAGNOSIS — I6523 Occlusion and stenosis of bilateral carotid arteries: Secondary | ICD-10-CM

## 2024-06-17 ENCOUNTER — Encounter (INDEPENDENT_AMBULATORY_CARE_PROVIDER_SITE_OTHER): Payer: Self-pay | Admitting: Vascular Surgery

## 2024-06-17 ENCOUNTER — Ambulatory Visit (INDEPENDENT_AMBULATORY_CARE_PROVIDER_SITE_OTHER): Admitting: Vascular Surgery

## 2024-06-17 VITALS — BP 147/73 | HR 68 | Resp 18 | Wt 175.2 lb

## 2024-06-17 DIAGNOSIS — I6523 Occlusion and stenosis of bilateral carotid arteries: Secondary | ICD-10-CM

## 2024-06-17 DIAGNOSIS — I10 Essential (primary) hypertension: Secondary | ICD-10-CM

## 2024-06-17 NOTE — Progress Notes (Signed)
 Patient ID: Johnell DELENA Piety, female   DOB: 1965-12-27, 58 y.o.   MRN: [id number]  Chief Complaint  Patient presents with   Follow-up    [facility] 3 week with carotid follow up    HPI Oluwatomisin A Esqueda is a 58 y.o. female.  Patient returns in follow-up of her carotid disease.  She is a few weeks status post right carotid stent for high-grade stenosis.  She is doing well.  She had no periprocedural complications.  Her duplex shows her right carotid stent to be widely patent and her left carotid artery falls in the 1 to 39% range.   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    Past Surgical History:  Procedure Laterality Date   CARDIAC SURGERY     CAROTID PTA/STENT INTERVENTION Right 05/23/2024   Procedure: CAROTID PTA/STENT INTERVENTION;  Surgeon: [name], MD;  Location: [facility] INVASIVE CV LAB;  Service: Cardiovascular;  Laterality: Right;   CESAREAN SECTION     x 3 - 1984-1994   MICROLARYNGOSCOPY W/VOCAL CORD INJECTION Bilateral 01/28/2019   Procedure: MICROLARYNGOSCOPY WITH VOCAL CORD EXCISION;  Surgeon: Herminio Miu, MD;  Location: [facility];  Service: ENT;  Laterality: Bilateral;   TONSILLECTOMY        No Known Allergies  Current Outpatient Medications  Medication Sig Dispense Refill   amLODipine  (NORVASC ) 10 MG tablet Take 1 tablet (10 mg total) by mouth daily. 90 tablet 3   aspirin  EC 81 MG tablet Take 1 tablet (81 mg total) by mouth daily. Swallow whole. 30 tablet 12   carvedilol  (COREG ) 25 MG tablet Take 1 tablet (25 mg total) by mouth 2 (two) times daily. 180 tablet 3   hydrALAZINE  (APRESOLINE ) 50 MG tablet Take 1 tablet (50 mg total) by  mouth 3 (three) times daily. 270 tablet 3   levothyroxine  (SYNTHROID ) 50 MCG tablet TAKE 1 TABLET BY MOUTH EVERY OTHER DAY, ALTERNATING WITH OTHER DOSE OF 75 MCG EVERY OTHER DAY. 45 tablet 3   levothyroxine  (SYNTHROID ) 75 MCG tablet TAKE 1 TABLET BY MOUTH EVERY OTHER DAY AND ALTERNATE WITH 50 MCG TABLET. 45 tablet 3   metFORMIN  (GLUCOPHAGE -XR) 750 MG 24 hr tablet Take 2 tablets (1,500 mg total) by mouth daily with breakfast. 180 tablet 3   olmesartan  (BENICAR ) 40 MG tablet Take 1 tablet (40 mg total) by mouth daily. 90 tablet 3   rosuvastatin  (CRESTOR ) 40 MG tablet Take 1 tablet (40 mg total) by mouth at bedtime. 90 tablet 3   Spacer/Aero-Holding Chambers (AEROCHAMBER MV) inhaler Use as instructed 1 each 2   ticagrelor  (BRILINTA ) 60 MG TABS tablet Take 1 tablet by mouth twice daily. 180 tablet 3   No current facility-administered medications for this visit.        Physical Exam BP (!) 147/73   Pulse 68   Resp 18   Wt 175 lb 3.2 oz (79.5 kg)   BMI 33.10 kg/m  Gen:  WD/WN, NAD Skin: incision C/D/I     Assessment/Plan:  Carotid artery stenosis  Her duplex shows her right carotid stent to be widely patent and her left carotid artery falls in the 1  to 39% range.  Continue aspirin , Brilinta , and Crestor .  Not on Plavix as she is on Brilinta .  No restrictions at this point.  Follow-up in 3 months with carotid duplex.  Essential hypertension blood pressure control important in reducing the progression of atherosclerotic disease. On appropriate oral medications.      Selinda Gu 06/17/2024, 9:59 AM   This note was created with Dragon medical transcription system.  Any errors from dictation are unintentional.

## 2024-06-17 NOTE — Assessment & Plan Note (Signed)
 Her duplex shows her right carotid stent to be widely patent and her left carotid artery falls in the 1 to 39% range.  Continue aspirin , Brilinta , and Crestor .  Not on Plavix as she is on Brilinta .  No restrictions at this point.  Follow-up in 3 months with carotid duplex.

## 2024-06-17 NOTE — Assessment & Plan Note (Signed)
 blood pressure control important in reducing the progression of atherosclerotic disease. On appropriate oral medications.

## 2024-06-21 ENCOUNTER — Telehealth: Payer: Self-pay | Admitting: Acute Care

## 2024-06-21 DIAGNOSIS — Z122 Encounter for screening for malignant neoplasm of respiratory organs: Secondary | ICD-10-CM

## 2024-06-21 DIAGNOSIS — Z87891 Personal history of nicotine dependence: Secondary | ICD-10-CM

## 2024-06-21 DIAGNOSIS — F17210 Nicotine dependence, cigarettes, uncomplicated: Secondary | ICD-10-CM

## 2024-06-21 NOTE — Telephone Encounter (Signed)
 Lung Cancer Screening Narrative/Criteria Questionnaire (Cigarette Smokers Only- No Cigars/Pipes/vapes)   Morgan Wright   SDMV:07/19/24 at 3p/Sarah                                           1965-09-30               LDCT: 07/20/24 at 3p/Opic    58 y.o.   Phone: [phone] /db  Lung Screening Narrative (confirm age 34-77 yrs Medicare / 50-80 yrs Private pay insurance)   Insurance information:BCBS   Referring Provider:Karamelagos   This screening involves an initial phone call with a team member from our program. It is called a shared decision making visit. The initial meeting is required by insurance and Medicare to make sure you understand the program. This appointment takes about 15-20 minutes to complete. The CT scan will completed at a separate date/time. This scan takes about 5-10 minutes to complete and you may eat and drink before and after the scan.  Criteria questions for Lung Cancer Screening:   Are you a current or former smoker? Current Age began smoking: 61 y   If you are a former smoker, what year did you quit smoking? NA   To calculate your smoking history, I need an accurate estimate of how many packs of cigarettes you smoked per day and for how many years. (Not just the number of PPD you are now smoking)   Years smoking 46 x Packs per day 1 = Pack years 46   (at least 20 pack yrs)   (Make sure they understand that we need to know how much they have smoked in the past, not just the number of PPD they are smoking now)  Do you have a personal history of cancer?  No    Do you have a family history of cancer? No  Are you coughing up blood?  No  Have you had unexplained weight loss of 15 lbs or more in the last 6 months? No  It looks like you meet all criteria.     Additional information: N/A

## 2024-07-11 ENCOUNTER — Other Ambulatory Visit: Admitting: Pharmacist

## 2024-07-11 DIAGNOSIS — I10 Essential (primary) hypertension: Secondary | ICD-10-CM

## 2024-07-11 DIAGNOSIS — I2510 Atherosclerotic heart disease of native coronary artery without angina pectoris: Secondary | ICD-10-CM

## 2024-07-11 NOTE — Progress Notes (Signed)
 07/11/2024 Name: Morgan Wright MRN: [id number] DOB: May 16, 1966  Chief Complaint  Patient presents with   Medication Management    Morgan Wright is a 58 y.o. year old female who presented for a telephone visit.   They were referred to the pharmacist by their PCP for assistance in managing medication access.      Subjective:   Care Team: Primary Care Provider: Edman Marsa PARAS, DO ; Next Scheduled Visit: 12/12/2024 Cardiologist: Perla Evalene PARAS, MD  Vascular Specialist: [name], MD; Next Scheduled Visit: 09/13/2024 GI Specialist: Maryruth Ole Revel, MD; Next Scheduled Visit: 07/26/2024 Pulmonologist: Ruthell Lauraine FALCON, NP; Next Scheduled Visit: 07/19/2024   Medication Access/Adherence  Current Pharmacy:  [email or web address] - [place], [place] - [address] [place] [address] Phone: [phone] Fax: [phone]  [facility] [address], [place] - [address] [place] [address] Phone: [phone] Fax: [phone]  [facility] - [facility] [address] [place] [address] Phone: [phone] Fax: [phone]   Patient reports affordability concerns with their medications: No  Patient reports access/transportation concerns to their pharmacy: No  Patient reports adherence concerns with their medications:  No     Reports started using weekly pillbox since we last spoke and denies missing doses since     PAD/ASCVD Risk Reduction/CAD   Current lipid lowering medications: rosuvastatin  40 mg daily   Antiplatelet regimen: ticagrelor  60 mg twice daily + aspirin  81 mg daily   ASCVD History: CAD/s/p CABG x 3 at [facility] in 2014; S/P Right Endovascular Stent placement 05/23/2024     Hypertension:   Current medications:  - olmesartan  40 mg daily - carvedilol  25 mg twice daily - amlodipine  10 mg daily -  hydralazine  50 mg three times daily   Reports has home upper arm blood blood pressure Does not have specific home blood pressure readings to review   Denies symptoms of hypotension     Diabetes:   Current medications: metformin  ER 750 - 2 tablets (1,500 mg) daily with breakfast   Denies monitoring home blood sugar readings    Objective:  Lab Results  Component Value Date   HGBA1C 6.1 (H) 04/22/2024    Lab Results  Component Value Date   CREATININE 1.32 (H) 05/24/2024   BUN 14 05/24/2024   NA 137 05/24/2024   K 3.8 05/24/2024   CL 106 05/24/2024   CO2 22 05/24/2024    Lab Results  Component Value Date   CHOL 109 04/22/2024   HDL 35 (L) 04/22/2024   LDLCALC 50 04/22/2024   TRIG 162 (H) 04/22/2024   CHOLHDL 3.1 04/22/2024   BP Readings from Last 3 Encounters:  06/17/24 (!) 147/73  06/13/24 138/70  05/24/24 (!) 171/50   Pulse Readings from Last 3 Encounters:  06/17/24 68  06/13/24 67  05/24/24 (!) 58     Medications Reviewed Today     Reviewed by [name], RPH-CPP (Pharmacist) on 07/11/24 at 1529  Med List Status: <None>   Medication Order Taking? Sig Documenting Provider Last Dose Status Informant  amLODipine  (NORVASC ) 10 MG tablet [id number] Yes Take 1 tablet (10 mg total) by mouth daily. Edman Marsa PARAS, DO  Active   aspirin  EC 81 MG tablet [id number] Yes Take 1 tablet (81 mg total) by mouth daily. Swallow whole. Edman Marsa PARAS, DO  Active  carvedilol  (COREG ) 25 MG tablet [id number] Yes Take 1 tablet (25 mg total) by mouth 2 (two) times daily. Edman Marsa PARAS, DO  Active   hydrALAZINE  (APRESOLINE ) 50 MG tablet [id number] Yes Take 1 tablet (50 mg total) by mouth 3 (three) times daily. Gollan, Timothy J, MD  Active   levothyroxine  (SYNTHROID ) 50 MCG tablet [id number]  TAKE 1 TABLET BY MOUTH EVERY OTHER DAY, ALTERNATING WITH OTHER DOSE OF 75 MCG EVERY OTHER DAY. Edman Marsa PARAS, DO  Active   levothyroxine   (SYNTHROID ) 75 MCG tablet [id number]  TAKE 1 TABLET BY MOUTH EVERY OTHER DAY AND ALTERNATE WITH 50 MCG TABLET. Edman Marsa PARAS, DO  Active   metFORMIN  (GLUCOPHAGE -XR) 750 MG 24 hr tablet [id number] Yes Take 2 tablets (1,500 mg total) by mouth daily with breakfast. Edman Marsa PARAS, DO  Active   olmesartan  (BENICAR ) 40 MG tablet [id number] Yes Take 1 tablet (40 mg total) by mouth daily. Karamalegos, Marsa PARAS, DO  Active   rosuvastatin  (CRESTOR ) 40 MG tablet [id number] Yes Take 1 tablet (40 mg total) by mouth at bedtime. Edman Marsa PARAS, DO  Active   Spacer/Aero-Holding Chambers (AEROCHAMBER MV) inhaler [id number]  Use as instructed Bernardino Ditch, NP  Active   ticagrelor  (BRILINTA ) 60 MG TABS tablet [id number] Yes Take 1 tablet by mouth twice daily. Gollan, Timothy J, MD  Active               Assessment/Plan:   Encourage patient to continue using weekly pillbox. Plans to start using additional once daily organizer for mid-day dose of hydralazine  at work Monday to Friday  PAD/ASCVD Risk Reduction/CAD - Discuss importance of adherence to antiplatelet regimen as prescribed by Vascular Surgeon - Leave message with [facility] today for confirmation regarding Brilinta  dosing for patient. Note patient currently taking Brilinta  60 mg twice daily (in addition to aspirin  81 mg daily) as listed on discharge list from 05/24/2024, same as dose from prior to admission that patient was taking as ordered by Dr. Gollan. Patient with history of CABG in 2010 and PAD. Note patient admitted to hospital 05/23/2024 to 05/24/2024 related to carotid stenosis/stent placement. Receive message back from April advising that she checked with NP Orvin Daring, who reviewed dose and confirmed patient to continue Brilinta  60 mg twice daily    Hypertension: - Reviewed long term cardiovascular and renal outcomes of uncontrolled blood pressure - Reviewed appropriate blood pressure monitoring  technique, including importance of monitoring at least 30 minutes after nicotine  use and importance of resting prior to readings - Recommended to check home blood pressure and heart rate, keep log of results and have this record to review at upcoming medical appointments. Patient to contact provider office sooner if needed for readings outside of established parameters or symptoms     Follow Up Plan: Clinical Pharmacist will follow up with patient by telephone on 08/08/2024 at 10:00 AM to review home blood pressure monitoring results     Sharyle Sia, PharmD, JAQUELINE, CPP Clinical Pharmacist [facility] [phone]

## 2024-07-11 NOTE — Patient Instructions (Signed)
 Goals Addressed             This Visit's Progress    Pharmacy Goals       It was great talking with you today!  Check your blood pressure once daily, and any time you have concerning symptoms like headache, chest pain, dizziness, shortness of breath, or vision changes.   Our goal is less than 130/80.  To appropriately check your blood pressure, make sure you do the following:  1) Avoid caffeine, exercise, or tobacco products for 30 minutes before checking. Empty your bladder. 2) Sit with your back supported in a flat-backed chair. Rest your arm on something flat (arm of the chair, table, etc). 3) Sit still with your feet flat on the floor, resting, for at least 5 minutes.  4) Check your blood pressure. Take 1-2 readings.  5) Write down these readings and bring with you to any provider appointments.  Bring your home blood pressure machine with you to a provider's office for accuracy comparison at least once a year.   Make sure you take your blood pressure medications before you come to any office visit, even if you were asked to fast for labs.    Thank you!  Sharyle Sia, PharmD, JAQUELINE, CPP Clinical Pharmacist [facility] [phone]

## 2024-07-17 LAB — COLOGUARD

## 2024-07-18 ENCOUNTER — Ambulatory Visit: Payer: Self-pay | Admitting: Family Medicine

## 2024-07-18 ENCOUNTER — Telehealth (INDEPENDENT_AMBULATORY_CARE_PROVIDER_SITE_OTHER): Payer: Self-pay | Admitting: Vascular Surgery

## 2024-07-18 NOTE — Telephone Encounter (Signed)
 Received Disability paperwork for patient. Had surgery on 05/19/24 w/JD. Patient was just told that she would be fired if she didn't get paperwork filled out. Papers in nurses box.

## 2024-07-19 ENCOUNTER — Ambulatory Visit

## 2024-07-19 ENCOUNTER — Ambulatory Visit: Admitting: Acute Care

## 2024-07-19 ENCOUNTER — Encounter: Payer: Self-pay | Admitting: Acute Care

## 2024-07-19 DIAGNOSIS — F17200 Nicotine dependence, unspecified, uncomplicated: Secondary | ICD-10-CM

## 2024-07-19 DIAGNOSIS — F17210 Nicotine dependence, cigarettes, uncomplicated: Secondary | ICD-10-CM

## 2024-07-19 NOTE — Telephone Encounter (Signed)
 Paperwork will be giving to provider

## 2024-07-19 NOTE — Patient Instructions (Signed)

## 2024-07-19 NOTE — Progress Notes (Addendum)
 Virtual Visit via Video Note  I connected with Morgan Wright on 07/19/24 at  3:00 PM EST by a video enabled telemedicine application and verified that I am speaking with the correct person using two identifiers.  Location: Patient: At home Provider: [address], [place], [place], [address]    I discussed the limitations of evaluation and management by telemedicine and the availability of in person appointments. The patient expressed understanding and agreed to proceed.   Shared Decision Making Visit Lung Cancer Screening Program [phone])   Eligibility: Age 58 y.o. Pack Years Smoking History Calculation 46 pack year smoking history (# packs/per year x # years smoked) Recent History of coughing up blood  no Unexplained weight loss? no ( >Than 15 pounds within the last 6 months ) Prior History Lung / other cancer no (Diagnosis within the last 5 years already requiring surveillance chest CT Scans). Smoking Status Current Smoker Former Smokers: Years since quit:  NA  Quit Date:  NA  Visit Components: Discussion included one or more decision making aids. yes Discussion included risk/benefits of screening. yes Discussion included potential follow up diagnostic testing for abnormal scans. yes Discussion included meaning and risk of over diagnosis. yes Discussion included meaning and risk of False Positives. yes Discussion included meaning of total radiation exposure. yes  Counseling Included: Importance of adherence to annual lung cancer LDCT screening. yes Impact of comorbidities on ability to participate in the program. yes Ability and willingness to under diagnostic treatment. yes  Smoking Cessation Counseling: Current Smokers:  Discussed importance of smoking cessation. yes Information about tobacco cessation classes and interventions provided to patient. yes Patient provided with ticket for LDCT Scan.  NA Symptomatic Patient. no  Counseling NA Diagnosis Code:  Tobacco Use Z72.0 Asymptomatic Patient yes  Counseling (Intermediate counseling: > three minutes counseling) H9563 Former Smokers:  Discussed the importance of maintaining cigarette abstinence. yes Diagnosis Code: Personal History of Nicotine  Dependence. S12.108 Information about tobacco cessation classes and interventions provided to patient. Yes Patient provided with ticket for LDCT Scan. yes Written Order for Lung Cancer Screening with LDCT placed in Epic. Yes (CT Chest Lung Cancer Screening Low Dose W/O CM) [id number] Z12.2-Screening of respiratory organs Z87.891-Personal history of nicotine  dependence  I counseled patient on smoking cessation x 3 minutes today. Please see [facility] for smoking cessation resources provided.    Lauraine JULIANNA Lites, NP 07/19/2024

## 2024-07-20 ENCOUNTER — Ambulatory Visit
Admission: RE | Admit: 2024-07-20 | Discharge: 2024-07-20 | Disposition: A | Discharge status: Home / Self Care | Source: Ambulatory Visit | Attending: Acute Care | Admitting: Acute Care

## 2024-07-20 DIAGNOSIS — Z87891 Personal history of nicotine dependence: Secondary | ICD-10-CM | POA: Diagnosis not present

## 2024-07-20 DIAGNOSIS — Z122 Encounter for screening for malignant neoplasm of respiratory organs: Secondary | ICD-10-CM | POA: Diagnosis not present

## 2024-07-20 DIAGNOSIS — F17210 Nicotine dependence, cigarettes, uncomplicated: Secondary | ICD-10-CM | POA: Diagnosis not present

## 2024-07-28 ENCOUNTER — Other Ambulatory Visit: Payer: Self-pay

## 2024-07-28 DIAGNOSIS — Z122 Encounter for screening for malignant neoplasm of respiratory organs: Secondary | ICD-10-CM

## 2024-07-28 DIAGNOSIS — Z87891 Personal history of nicotine dependence: Secondary | ICD-10-CM

## 2024-07-28 DIAGNOSIS — F17210 Nicotine dependence, cigarettes, uncomplicated: Secondary | ICD-10-CM

## 2024-08-08 ENCOUNTER — Other Ambulatory Visit

## 2024-08-08 ENCOUNTER — Telehealth: Payer: Self-pay | Admitting: Pharmacist

## 2024-08-08 NOTE — Telephone Encounter (Signed)
 Call to patient to advise her paperwork has been completed and faxed off and we have left a physical copy for her records at the front desk. Unable to reach voice mail full, mychart msg sent.

## 2024-08-08 NOTE — Progress Notes (Unsigned)
° °  Outreach Note  08/08/2024 Name: Morgan Wright MRN: [id number] DOB: December 21, 1965  Referred by: Edman Marsa PARAS, DO  Was unable to reach patient via telephone today and unable to leave a message as patient's voicemail is full.   Follow Up Plan: Will collaborate with Care Guide to outreach to schedule follow up with me  Sharyle Sia, PharmD, JAQUELINE, CPP Clinical Pharmacist [facility] [phone]

## 2024-08-08 NOTE — Telephone Encounter (Signed)
 Patient contacted [facility] stating that Morgan Wright her disability provider said they have not received her paperwork. Checking on status

## 2024-08-08 NOTE — Telephone Encounter (Signed)
It is in my box  \

## 2024-08-15 ENCOUNTER — Other Ambulatory Visit: Admitting: Pharmacist

## 2024-08-15 DIAGNOSIS — I10 Essential (primary) hypertension: Secondary | ICD-10-CM

## 2024-08-15 NOTE — Progress Notes (Signed)
 "  08/15/2024 Name: Morgan Wright MRN: [id number] DOB: Jan 24, 1966  Chief Complaint  Patient presents with   Medication Management   Medication Adherence    Morgan Wright is a 58 y.o. year old female who presented for a telephone visit.   They were referred to the pharmacist by their PCP for assistance in managing medication access.      Subjective:   Care Team: Primary Care Provider: Edman Marsa PARAS, DO ; Next Scheduled Visit: 12/12/2024 Cardiologist: Perla Evalene PARAS, MD  Vascular Specialist: [name], MD; Next Scheduled Visit: 09/13/2024 GI Specialist: Maryruth Ole Revel, MD Pulmonologist: Ruthell Lauraine FALCON, NP    Medication Access/Adherence  Current Pharmacy:  [email or web address] - [facility] - [place], [place] - [address] [place] [address] Phone: [phone] Fax: [phone]  [facility] [address] [place] - [address] [place] [address] Phone: [phone] Fax: [phone]  [facility] - [facility] [address] [place] [address] Phone: [phone] Fax: [phone]   Patient reports affordability concerns with their medications: No  Patient reports access/transportation concerns to their pharmacy: No  Patient reports adherence concerns with their medications:  No     Patient using weekly pillbox - Reports also picked up a separate organizer for mid-day dose of hydralazine  that is easy to bring to work/also use at home on the weekends - Denies missed doses now   PAD/ASCVD Risk Reduction/CAD   Current lipid lowering medications: rosuvastatin  40 mg daily   Antiplatelet regimen: ticagrelor  60 mg twice daily + aspirin  81 mg daily   ASCVD History: CAD/s/p CABG x 3 at [facility] in 2014; S/P Right Endovascular Stent placement 05/23/2024     Hypertension:   Current medications:  - olmesartan  40 mg  daily - carvedilol  25 mg twice daily - amlodipine  10 mg daily - hydralazine  50 mg three times daily   Patient has home upper arm blood blood pressure. Reports has been monitoring at home and recalls readings running ~130/85 in the mornings, but unable to share specific readings today as has not been writing these down   Denies symptoms of hypotension    Admits to sometimes lightly salting foods    Diabetes:   Current medications: metformin  ER 750 - 2 tablets (1,500 mg) daily with breakfast   Denies monitoring home blood sugar readings   Objective:  Lab Results  Component Value Date   HGBA1C 6.1 (H) 04/22/2024    Lab Results  Component Value Date   CREATININE 1.32 (H) 05/24/2024   BUN 14 05/24/2024   NA 137 05/24/2024   K 3.8 05/24/2024   CL 106 05/24/2024   CO2 22 05/24/2024    Lab Results  Component Value Date   CHOL 109 04/22/2024   HDL 35 (L) 04/22/2024   LDLCALC 50 04/22/2024   TRIG 162 (H) 04/22/2024   CHOLHDL 3.1 04/22/2024   BP Readings from Last 3 Encounters:  06/17/24 (!) 147/73  06/13/24 138/70  05/24/24 (!) 171/50   Pulse Readings from Last 3 Encounters:  06/17/24 68  06/13/24 67  05/24/24 (!) 58     Medications Reviewed Today     Reviewed by [name], RPH-CPP (Pharmacist) on 08/15/24 at 1027  Med List Status: <None>   Medication Order Taking? Sig Documenting Provider Last Dose Status Informant  amLODipine  (NORVASC ) 10 MG tablet [id number] Yes Take  1 tablet (10 mg total) by mouth daily. Edman Marsa PARAS, DO  Active   aspirin  EC 81 MG tablet [id number] Yes Take 1 tablet (81 mg total) by mouth daily. Swallow whole. Edman Marsa PARAS, DO  Active   carvedilol  (COREG ) 25 MG tablet [id number] Yes Take 1 tablet (25 mg total) by mouth 2 (two) times daily. Edman Marsa PARAS, DO  Active   hydrALAZINE  (APRESOLINE ) 50 MG tablet [id number] Yes Take 1 tablet (50 mg total) by mouth 3 (three) times daily. Gollan, Timothy J, MD   Active   levothyroxine  (SYNTHROID ) 50 MCG tablet [id number] Yes TAKE 1 TABLET BY MOUTH EVERY OTHER DAY, ALTERNATING WITH OTHER DOSE OF 75 MCG EVERY OTHER DAY. Edman Marsa PARAS, DO  Active   levothyroxine  (SYNTHROID ) 75 MCG tablet [id number] Yes TAKE 1 TABLET BY MOUTH EVERY OTHER DAY AND ALTERNATE WITH 50 MCG TABLET. Edman, Marsa PARAS, DO  Active   metFORMIN  (GLUCOPHAGE -XR) 750 MG 24 hr tablet [id number] Yes Take 2 tablets (1,500 mg total) by mouth daily with breakfast. Edman Marsa PARAS, DO  Active   olmesartan  (BENICAR ) 40 MG tablet [id number] Yes Take 1 tablet (40 mg total) by mouth daily. Edman Marsa PARAS, DO  Active   rosuvastatin  (CRESTOR ) 40 MG tablet [id number] Yes Take 1 tablet (40 mg total) by mouth at bedtime. Edman Marsa PARAS, DO  Active   Spacer/Aero-Holding Chambers (AEROCHAMBER MV) inhaler [id number]  Use as instructed Bernardino Ditch, NP  Active   ticagrelor  (BRILINTA ) 60 MG TABS tablet [id number] Yes Take 1 tablet by mouth twice daily. Gollan, Timothy J, MD  Active               Assessment/Plan:   Encourage patient to continue using weekly pillboxes    Hypertension: - Reviewed long term cardiovascular and renal outcomes of uncontrolled blood pressure - Reviewed appropriate blood pressure monitoring technique, including importance of monitoring at least 30 minutes after nicotine  use and importance of resting prior to readings - Encourage patient to limit intake of salt/sodium  Recommend to review nutrition labels for sodium content of foods - Recommended to check home blood pressure and heart rate, keep log of results and have this record to review at upcoming medical appointments. Patient to contact provider office sooner if needed for readings outside of established parameters or symptoms     Follow Up Plan:   Patient denies further medication questions or concerns today Provide patient with contact information for clinic pharmacist to  contact if needed in future for medication questions/concerns    Sharyle Sia, PharmD, JAQUELINE, CPP Clinical Pharmacist [facility] [phone]   "

## 2024-08-15 NOTE — Patient Instructions (Signed)
 Goals Addressed             This Visit's Progress    Pharmacy Goals       It was great talking with you today!  Check your blood pressure once daily, and any time you have concerning symptoms like headache, chest pain, dizziness, shortness of breath, or vision changes.   Our goal is less than 130/80.  To appropriately check your blood pressure, make sure you do the following:  1) Avoid caffeine, exercise, or tobacco products for 30 minutes before checking. Empty your bladder. 2) Sit with your back supported in a flat-backed chair. Rest your arm on something flat (arm of the chair, table, etc). 3) Sit still with your feet flat on the floor, resting, for at least 5 minutes.  4) Check your blood pressure. Take 1-2 readings.  5) Write down these readings and bring with you to any provider appointments.  Bring your home blood pressure machine with you to a provider's office for accuracy comparison at least once a year.   Make sure you take your blood pressure medications before you come to any office visit, even if you were asked to fast for labs.    Thank you!  Sharyle Sia, PharmD, JAQUELINE, CPP Clinical Pharmacist [facility] [phone]

## 2024-09-13 ENCOUNTER — Ambulatory Visit (INDEPENDENT_AMBULATORY_CARE_PROVIDER_SITE_OTHER): Admitting: Nurse Practitioner

## 2024-09-13 ENCOUNTER — Encounter (INDEPENDENT_AMBULATORY_CARE_PROVIDER_SITE_OTHER): Payer: Self-pay | Admitting: Vascular Surgery

## 2024-09-13 ENCOUNTER — Other Ambulatory Visit (INDEPENDENT_AMBULATORY_CARE_PROVIDER_SITE_OTHER)

## 2024-09-13 VITALS — BP 156/77 | HR 63 | Resp 17 | Ht 61.0 in | Wt 175.4 lb

## 2024-09-13 DIAGNOSIS — I6523 Occlusion and stenosis of bilateral carotid arteries: Secondary | ICD-10-CM

## 2024-09-13 DIAGNOSIS — E1169 Type 2 diabetes mellitus with other specified complication: Secondary | ICD-10-CM | POA: Diagnosis not present

## 2024-09-13 DIAGNOSIS — I10 Essential (primary) hypertension: Secondary | ICD-10-CM | POA: Diagnosis not present

## 2024-09-17 ENCOUNTER — Encounter (INDEPENDENT_AMBULATORY_CARE_PROVIDER_SITE_OTHER): Payer: Self-pay | Admitting: Nurse Practitioner

## 2024-09-17 NOTE — Progress Notes (Signed)
 "  Subjective:    Patient ID: Morgan Wright, female    DOB: 10-31-1965, 59 y.o.   MRN: [id number] Chief Complaint  Patient presents with   Follow-up     3 months + Carotid     HPI  Discussed the use of AI scribe software for clinical note transcription with the patient, who gave verbal consent to proceed.  History of Present Illness Morgan Wright is a 59 year old female with bilateral carotid artery stenosis, status post right carotid stent placement, who presents for routine vascular surgery follow-up.  She underwent right carotid artery stenting in the recent past and is being monitored for bilateral carotid artery disease. The patient reports no history of transient ischemic attack, stroke, syncope, or neck pain.  Recent carotid ultrasound demonstrates a patent right carotid stent with less than 50% residual stenosis. The left carotid artery, which has not been previously treated, shows mild progression of stenosis, increasing from the 1-39% range to the 40-59% range.  She continues dual antiplatelet therapy with ticagrelor  and aspirin  as previously prescribed.  She does not report any ongoing respiratory symptoms.    Results Diagnostic Carotid artery duplex ultrasound bilateral (09/13/2024): Right carotid stent patent with less than 50% stenosis; left carotid artery stenosis 40-59%.   Review of Systems  All other systems reviewed and are negative.      Objective:   Physical Exam Vitals reviewed.  HENT:     Head: Normocephalic.  Neck:     Vascular: No carotid bruit.  Cardiovascular:     Rate and Rhythm: Normal rate.     Pulses: Normal pulses.  Pulmonary:     Effort: Pulmonary effort is normal.  Skin:    General: Skin is warm and dry.  Neurological:     Mental Status: She is alert and oriented to person, place, and time.  Psychiatric:        Mood and Affect: Mood normal.        Behavior: Behavior normal.        Thought Content: Thought content normal.         Judgment: Judgment normal.     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    Social History   Socioeconomic History   Marital status: Single    Spouse name: Not on file   Number of children: Not on file   Years of education: High School   Highest education level: High school graduate  Occupational History   Occupation: Shipping/Receiving    Comment: [employer] [place]  Tobacco Use   Smoking status: Every Day    Current packs/day: 0.75    Average packs/day: 0.8 packs/day for 40.0 years (30.0 ttl pk-yrs)    Types: Cigarettes   Smokeless tobacco: Never  Vaping Use   Vaping status: Never Used  Substance and Sexual Activity   Alcohol use:  No   Drug use: Never   Sexual activity: Not on file  Other Topics Concern   Not on file  Social History Narrative   Not on file   Social Drivers of Health   Tobacco Use: High Risk (09/17/2024)   Patient History    Smoking Tobacco Use: Every Day    Smokeless Tobacco Use: Never    Passive Exposure: Not on file  Financial Resource Strain: Low Risk  (01/14/2024)   Received from [facility]   Overall Financial Resource Strain (CARDIA)    Difficulty of Paying Living Expenses: Not hard at all  Food Insecurity: No Food Insecurity (05/25/2024)   Epic    Worried About Running Out of Food in the Last Year: Never true    Ran Out of Food in the Last Year: Never true  Transportation Needs: No Transportation Needs (05/25/2024)   Epic     Lack of Transportation (Medical): No    Lack of Transportation (Non-Medical): No  Physical Activity: Not on file  Stress: Not on file  Social Connections: Not on file  Intimate Partner Violence: Not At Risk (05/25/2024)   Epic    Fear of Current or Ex-Partner: No    Emotionally Abused: No    Physically Abused: No    Sexually Abused: No  Depression (PHQ2-9): Low Risk (12/09/2023)   Depression (PHQ2-9)    PHQ-2 Score: 0  Alcohol Screen: Not on file  Housing: Unknown (05/25/2024)   Epic    Unable to Pay for Housing in the Last Year: No    Number of Times Moved in the Last Year: Not on file    Homeless in the Last Year: No  Utilities: Not At Risk (05/25/2024)   Epic    Threatened with loss of utilities: No  Health Literacy: Not on file    Past Surgical History:  Procedure Laterality Date   CARDIAC SURGERY     CAROTID PTA/STENT INTERVENTION Right 05/23/2024   Procedure: CAROTID PTA/STENT INTERVENTION;  Surgeon: [name], MD;  Location: [facility];  Service: Cardiovascular;  Laterality: Right;   CESAREAN SECTION     x 3 - 1984-1994   MICROLARYNGOSCOPY W/VOCAL CORD INJECTION Bilateral 01/28/2019   Procedure: MICROLARYNGOSCOPY WITH VOCAL CORD EXCISION;  Surgeon: Herminio Miu, MD;  Location: [facility];  Service: ENT;  Laterality: Bilateral;   TONSILLECTOMY      Family History  Problem Relation Age of Onset   Heart disease Mother    Anuerysm Father 68   Lymphoma Sister        burkit    Allergies[1]     Latest Ref Rng & Units 05/24/2024    5:05 AM 04/22/2024    8:24 AM 06/23/2022    3:24 PM  CBC  WBC 4.0 - 10.5 K/uL 11.8  14.2  7.0   Hemoglobin 12.0 - 15.0 g/dL 87.7  85.7  86.5   Hematocrit 36.0 - 46.0 % 36.0  42.9  39.6   Platelets 150 - 400 K/uL 227  271  177       CMP     Component Value Date/Time   NA 137 05/24/2024 0505   NA 140 04/06/2024 1126   NA 133 (L) 06/15/2012 0915   K 3.8 05/24/2024 0505   K 4.3 06/15/2012 0915   CL 106  05/24/2024 0505   CL 98 06/15/2012 0915   CO2 22 05/24/2024 0505   CO2 26 06/15/2012 0915   GLUCOSE 114 (H)  05/24/2024 0505   GLUCOSE 166 (H) 06/15/2012 0915   BUN 14 05/24/2024 0505   BUN 13 04/06/2024 1126   BUN 8 06/15/2012 0915   CREATININE 1.32 (H) 05/24/2024 0505   CREATININE 1.27 (H) 04/22/2024 0824   CALCIUM  9.0 05/24/2024 0505   CALCIUM  9.1 06/15/2012 0915   PROT 7.0 04/22/2024 0824   PROT 7.1 06/15/2012 0915   ALBUMIN 3.9 10/29/2021 1217   ALBUMIN 3.5 06/15/2012 0915   AST 19 04/22/2024 0824   AST 14 (L) 06/15/2012 0915   ALT 19 04/22/2024 0824   ALT 25 06/15/2012 0915   ALKPHOS 56 10/29/2021 1217   ALKPHOS 71 06/15/2012 0915   BILITOT 0.5 04/22/2024 0824   BILITOT 0.5 06/15/2012 0915   EGFR 49 (L) 04/22/2024 0824   EGFR 56 (L) 04/06/2024 1126   GFRNONAA 47 (L) 05/24/2024 0505   GFRNONAA 100 03/07/2019 0809     No results found.     Assessment & Plan:   1. Bilateral carotid artery stenosis (Primary) Bilateral carotid artery stenosis status post right carotid stent placement Bilateral carotid stenosis with patent right stent and mild progression on the left. No symptoms or need for intervention. - Scheduled carotid ultrasound in 6 months to monitor right stent patency and left stenosis. - Continue annual surveillance of left carotid artery unless stenosis progresses to 60-79%, then increase to every 6 months. - Continue ticagrelor  and aspirin . - Emphasized smoking cessation. - VAS US  CAROTID; Future    2. Essential hypertension Continue antihypertensive medications as already ordered, these medications have been reviewed and there are no changes at this time.  3. Type 2 diabetes mellitus with other specified complication, without long-term current use of insulin (HCC) Continue hypoglycemic medications as already ordered, these medications have been reviewed and there are no changes at this time.  Hgb A1C to be monitored as already arranged by primary  service  Assessment and Plan Assessment & Plan      Medications Ordered Prior to Encounter[2]  There are no Patient Instructions on file for this visit. Return in about 6 months (around 03/13/2025) for with carotid duplex JD/FB.   Joany Khatib E Lupie Sawa, NP      [1] No Known Allergies [2]  Current Outpatient Medications on File Prior to Visit  Medication Sig Dispense Refill   amLODipine  (NORVASC ) 10 MG tablet Take 1 tablet (10 mg total) by mouth daily. 90 tablet 3   aspirin  EC 81 MG tablet Take 1 tablet (81 mg total) by mouth daily. Swallow whole. 30 tablet 12   carvedilol  (COREG ) 25 MG tablet Take 1 tablet (25 mg total) by mouth 2 (two) times daily. 180 tablet 3   hydrALAZINE  (APRESOLINE ) 50 MG tablet Take 1 tablet (50 mg total) by mouth 3 (three) times daily. 270 tablet 3   levothyroxine  (SYNTHROID ) 50 MCG tablet TAKE 1 TABLET BY MOUTH EVERY OTHER DAY, ALTERNATING WITH OTHER DOSE OF 75 MCG EVERY OTHER DAY. 45 tablet 3   levothyroxine  (SYNTHROID ) 75 MCG tablet TAKE 1 TABLET BY MOUTH EVERY OTHER DAY AND ALTERNATE WITH 50 MCG TABLET. 45 tablet 3   metFORMIN  (GLUCOPHAGE -XR) 750 MG 24 hr tablet Take 2 tablets (1,500 mg total) by mouth daily with breakfast. 180 tablet 3   olmesartan  (BENICAR ) 40 MG tablet Take 1 tablet (40 mg total) by mouth daily. 90 tablet 3   rosuvastatin  (CRESTOR ) 40 MG tablet Take 1 tablet (40 mg total) by mouth at bedtime. 90 tablet 3   Spacer/Aero-Holding Chambers (AEROCHAMBER MV)  inhaler Use as instructed 1 each 2   ticagrelor  (BRILINTA ) 60 MG TABS tablet Take 1 tablet by mouth twice daily. 180 tablet 3   No current facility-administered medications on file prior to visit.   "

## 2024-10-06 ENCOUNTER — Ambulatory Visit: Admitting: Medical

## 2024-12-12 ENCOUNTER — Ambulatory Visit: Admitting: Family Medicine

## 2025-03-14 ENCOUNTER — Ambulatory Visit (INDEPENDENT_AMBULATORY_CARE_PROVIDER_SITE_OTHER): Admitting: Nurse Practitioner

## 2025-03-14 ENCOUNTER — Encounter (INDEPENDENT_AMBULATORY_CARE_PROVIDER_SITE_OTHER)

## 2025-03-31 ENCOUNTER — Encounter (INDEPENDENT_AMBULATORY_CARE_PROVIDER_SITE_OTHER)

## 2025-03-31 ENCOUNTER — Ambulatory Visit (INDEPENDENT_AMBULATORY_CARE_PROVIDER_SITE_OTHER): Admitting: Nurse Practitioner
